# Patient Record
Sex: Male | Born: 1955 | Race: White | Hispanic: No | Marital: Married | Smoking: Former smoker
Health system: Southern US, Community
[De-identification: ages and names within clinical notes are randomized; demographics above are authoritative.]

## PROBLEM LIST (undated history)

## (undated) DIAGNOSIS — N419 Inflammatory disease of prostate, unspecified: Secondary | ICD-10-CM

## (undated) DIAGNOSIS — I10 Essential (primary) hypertension: Secondary | ICD-10-CM

## (undated) DIAGNOSIS — E785 Hyperlipidemia, unspecified: Secondary | ICD-10-CM

## (undated) HISTORY — DX: Essential (primary) hypertension: I10

## (undated) HISTORY — PX: VITRECTOMY: SHX106

## (undated) HISTORY — DX: Hyperlipidemia, unspecified: E78.5

---

## 2005-06-09 HISTORY — PX: COLONOSCOPY: SHX174

## 2012-08-26 ENCOUNTER — Telehealth: Payer: Self-pay | Admitting: Oncology

## 2012-08-26 NOTE — Telephone Encounter (Signed)
[facility] FOR PT TO RETURN CALL.  °

## 2012-08-31 ENCOUNTER — Telehealth: Payer: Self-pay | Admitting: Oncology

## 2012-08-31 NOTE — Telephone Encounter (Signed)
S/W PT IN RE NP APPT 04/17 @ 10:30 W/DR. SHADAD REFERRING DR. LITTLE  DX=HEMACHROMATOSIS WELCOME PACKET MAILED.

## 2012-08-31 NOTE — Telephone Encounter (Signed)
C/D 08/31/12 for appt.09/23/12

## 2012-09-16 ENCOUNTER — Other Ambulatory Visit: Payer: Self-pay | Admitting: Oncology

## 2012-09-16 DIAGNOSIS — D751 Secondary polycythemia: Secondary | ICD-10-CM

## 2012-09-17 ENCOUNTER — Telehealth: Payer: Self-pay | Admitting: Oncology

## 2012-09-17 NOTE — Telephone Encounter (Signed)
LVOM FOR PT IN RE TO NP APPT  R/S.

## 2012-09-23 ENCOUNTER — Other Ambulatory Visit: Payer: Self-pay | Admitting: Lab

## 2012-09-23 ENCOUNTER — Ambulatory Visit: Payer: Self-pay | Admitting: Oncology

## 2012-09-23 ENCOUNTER — Ambulatory Visit: Payer: Self-pay

## 2012-10-06 ENCOUNTER — Encounter: Payer: Self-pay | Admitting: Oncology

## 2012-10-07 ENCOUNTER — Encounter: Payer: Self-pay | Admitting: Oncology

## 2012-10-07 ENCOUNTER — Ambulatory Visit: Payer: BC Managed Care – PPO

## 2012-10-07 ENCOUNTER — Other Ambulatory Visit (HOSPITAL_BASED_OUTPATIENT_CLINIC_OR_DEPARTMENT_OTHER): Payer: BC Managed Care – PPO | Admitting: Lab

## 2012-10-07 ENCOUNTER — Ambulatory Visit (HOSPITAL_BASED_OUTPATIENT_CLINIC_OR_DEPARTMENT_OTHER): Payer: BC Managed Care – PPO | Admitting: Oncology

## 2012-10-07 DIAGNOSIS — D751 Secondary polycythemia: Secondary | ICD-10-CM

## 2012-10-07 LAB — COMPREHENSIVE METABOLIC PANEL (CC13)
ALT: 14 U/L (ref 0–55)
BUN: 18.7 mg/dL (ref 7.0–26.0)
CO2: 30 mEq/L — ABNORMAL HIGH (ref 22–29)
Calcium: 10 mg/dL (ref 8.4–10.4)
Creatinine: 1.2 mg/dL (ref 0.7–1.3)
Glucose: 108 mg/dl — ABNORMAL HIGH (ref 70–99)
Total Bilirubin: 0.5 mg/dL (ref 0.20–1.20)

## 2012-10-07 LAB — CBC WITH DIFFERENTIAL/PLATELET
BASO%: 0.7 % (ref 0.0–2.0)
Basophils Absolute: 0 10*3/uL (ref 0.0–0.1)
HCT: 51.5 % — ABNORMAL HIGH (ref 38.4–49.9)
HGB: 17.7 g/dL — ABNORMAL HIGH (ref 13.0–17.1)
LYMPH%: 14.6 % (ref 14.0–49.0)
MCHC: 34.4 g/dL (ref 32.0–36.0)
MONO#: 0.5 10*3/uL (ref 0.1–0.9)
NEUT%: 76.4 % — ABNORMAL HIGH (ref 39.0–75.0)
Platelets: 243 10*3/uL (ref 140–400)
WBC: 6.2 10*3/uL (ref 4.0–10.3)

## 2012-10-07 LAB — IRON AND TIBC
%SAT: 42 % (ref 20–55)
Iron: 130 ug/dL (ref 42–165)
TIBC: 307 ug/dL (ref 215–435)
UIBC: 177 ug/dL (ref 125–400)

## 2012-10-07 LAB — FERRITIN: Ferritin: 87 ng/mL (ref 22–322)

## 2012-10-07 NOTE — Progress Notes (Signed)
Checked in new pt with no financial concerns. °

## 2012-10-07 NOTE — Patient Instructions (Addendum)
1.  Issue:  Elevated Hemoglobin. 2.  Potential causes:  Hemochromatosis;  Polycythemia vera (PV)  3.  Work up:   *  Iron panel:  If elevated % saturation; will then proceed to gene test to rule out hemochromatosis.  *  If normal iron panel; then need to rule out PV with another gene test.  4.  Follow up:  Pending work up.

## 2012-10-07 NOTE — Progress Notes (Signed)
[facility]  Telephone:([phone] Fax:([phone]) [phone]     INITIAL HEMATOLOGY CONSULTATION    Referral MD:  Catha Gosselin, M.D.   Reason for Referral: Polycythemia     HPI: Oscar Bird is a 84 your man with distant history of smoking which he quit more than 30 years ago. He is also very active and runs about 50 miles a week. He has been having polycythemia vera for the past 3 years. He was kindly referred to the cancer Center for evaluation.   Oscar Bird presented to the clinic for the first time today with his wife. He denies any problem. Patient denies fever, decreased libido, change in his skin tone, erythromelalgia, anorexia, weight loss, fatigue, headache, visual changes, confusion, drenching night sweats, palpable lymph node swelling, mucositis, odynophagia, dysphagia, nausea vomiting, jaundice, chest pain, palpitation, shortness of breath, dyspnea on exertion, productive cough, gum bleeding, epistaxis, hematemesis, hemoptysis, abdominal pain, abdominal swelling, early satiety, melena, hematochezia, hematuria, skin rash, spontaneous bleeding, joint swelling, joint pain, heat or cold intolerance, bowel bladder incontinence, back pain, focal motor weakness, paresthesia, depression.    Past Medical History  Diagnosis Date  . HTN (hypertension)   . Borderline hyperlipidemia     diet control  :    Past Surgical History  Procedure Laterality Date  . Colonoscopy  2007    negative reportedly with [facility]  :   CURRENT MEDS: Current Outpatient Prescriptions  Medication Sig Dispense Refill  . CINNAMON PO Take 1 each by mouth daily as needed.      Marland Kitchen OVER THE COUNTER MEDICATION Serra peptase twice daily      . valsartan (DIOVAN) 160 MG tablet Take 160 mg by mouth daily.       No current facility-administered medications for this visit.      No Known Allergies:  Family History  Problem Relation Age of Onset  . Alzheimer's disease Mother   .  Heart attack Father   :  History   Social History  . Marital Status: Married    Spouse Name: N/A    Number of Children: 2  . Years of Education: N/A   Occupational History  .      manage realestate   Social History Main Topics  . Smoking status: Former Smoker -- 0.50 packs/day for 10 years    Quit date: 06/09/1973  . Smokeless tobacco: Former Neurosurgeon    Quit date: 06/09/1973  . Alcohol Use: No  . Drug Use: No  . Sexually Active: Not on file   Other Topics Concern  . Not on file   Social History Narrative  . No narrative on file  :  Exam: ECOG 0.   General:  well-nourished man, in no acute distress.  Eyes:  no scleral icterus.  ENT:  There were no oropharyngeal lesions.  Neck was without thyromegaly.  Lymphatics:  Negative cervical, supraclavicular or axillary adenopathy.  Respiratory: lungs were clear bilaterally without wheezing or crackles.  Cardiovascular:  Regular rate and rhythm, S1/S2, without murmur, rub or gallop.  There was no pedal edema.  GI:  abdomen was soft, flat, nontender, nondistended, without organomegaly.  Muscoloskeletal:  no spinal tenderness of palpation of vertebral spine.  Skin exam was without echymosis, petichae.  Neuro exam was nonfocal.  Patient was able to get on and off exam table without assistance.  Gait was normal.  Patient was alerted and oriented.  Attention was good.   Language was appropriate.  Mood was  normal without depression.  Speech was not pressured.  Thought content was not tangential.    LABS:  Lab Results  Component Value Date   WBC 6.2 10/07/2012   HGB 17.7* 10/07/2012   HCT 51.5* 10/07/2012   PLT 243 10/07/2012   GLUCOSE 108* 10/07/2012   ALT 14 10/07/2012   AST 17 10/07/2012   NA 146* 10/07/2012   K 5.5* 10/07/2012   CL 106 10/07/2012   CREATININE 1.2 10/07/2012   BUN 18.7 10/07/2012   CO2 30* 10/07/2012     ASSESSMENT AND PLAN:   1.  Issue:  Elevated Hemoglobin. 2.  Potential causes:  Polycythemia vera (PV) vs naturally elevated due to  his high level of aerobic exercise.  He has no symptoms of COPD, OSA, or takes diuretic to account for secondary polycythemia.  He has no family history or symptoms to suggest hemochromatosis 3.  Work up:   *  Iron panel:  If elevated % saturation; will then proceed to gene test to rule out hemochromatosis.  *  If normal iron panel; then need to rule out PV with JAK-2 mutation testing.  4.  Follow up:  Pending work up.   The length of time of the face-to-face encounter was 30 minutes. More than 50% of time was spent counseling and coordination of care.     Thank you for this referral.

## 2012-10-11 ENCOUNTER — Telehealth: Payer: Self-pay | Admitting: Oncology

## 2012-10-11 ENCOUNTER — Other Ambulatory Visit: Payer: Self-pay | Admitting: Oncology

## 2012-10-11 ENCOUNTER — Encounter: Payer: Self-pay | Admitting: Oncology

## 2012-10-11 DIAGNOSIS — D45 Polycythemia vera: Secondary | ICD-10-CM

## 2012-10-11 NOTE — Telephone Encounter (Signed)
lvm for pt regarding to 5.12 and 5.22.14 appt.Marland KitchenMarland KitchenMarland Kitchen

## 2012-10-18 ENCOUNTER — Other Ambulatory Visit: Payer: BC Managed Care – PPO | Admitting: Lab

## 2012-10-18 DIAGNOSIS — D45 Polycythemia vera: Secondary | ICD-10-CM

## 2012-10-20 ENCOUNTER — Ambulatory Visit: Payer: Self-pay | Admitting: Oncology

## 2012-10-20 ENCOUNTER — Ambulatory Visit: Payer: Self-pay

## 2012-10-20 ENCOUNTER — Other Ambulatory Visit: Payer: Self-pay | Admitting: Lab

## 2012-10-26 NOTE — Patient Instructions (Addendum)
1.  Issue:   Elevated hemoglobin (polycythemia). 2.  Testing:  A.  Screening for hemochromatosis was negative due to lack of family history and normal iron panel.  B.  Test for polycythemia vera (PV) was negative.  This rules out PV with about 90% certainty.   3.  What to do:  A.  Continue to monitor CBC about once a year.  B.  In the future, if hemoglobin continues to increase, we may consider diagnostic bone marrow biopsy.   C.  There is no indication to treat secondary polycythemia since there is no elevated risk of blood clot.  May consider blood donation if would like to normalize hemoglobin; however, this is not a must.

## 2012-10-28 ENCOUNTER — Ambulatory Visit (HOSPITAL_BASED_OUTPATIENT_CLINIC_OR_DEPARTMENT_OTHER): Payer: BC Managed Care – PPO | Admitting: Oncology

## 2012-10-28 VITALS — BP 161/92 | HR 96 | Temp 98.6°F | Resp 18 | Ht 68.0 in | Wt 143.4 lb

## 2012-10-28 DIAGNOSIS — D751 Secondary polycythemia: Secondary | ICD-10-CM

## 2012-10-29 NOTE — Progress Notes (Signed)
   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   CURRENT THERAPY:  Watchful observation.   INTERVAL HISTORY: Oscar Bird 57 y.o. male returns for regular follow up to discuss result of work up with his wife. He noticed that the palms of his hands are more flush than before.  He denied all other symptoms.   Past Medical History  Diagnosis Date  . HTN (hypertension)   . Borderline hyperlipidemia     diet control    Past Surgical History  Procedure Laterality Date  . Colonoscopy  2007    negative reportedly with [facility]    Current Outpatient Prescriptions  Medication Sig Dispense Refill  . CINNAMON PO Take 1 each by mouth daily as needed.      . diphenhydrAMINE (BENADRYL) 25 MG tablet Take 12.5 mg by mouth at bedtime as needed for itching or sleep.      Marland Kitchen OVER THE COUNTER MEDICATION Serra peptase twice daily      . valsartan (DIOVAN) 160 MG tablet Take 160 mg by mouth daily.       No current facility-administered medications for this visit.    ALLERGIES:  has No Known Allergies.  REVIEW OF SYSTEMS:  The rest of the 14-point review of system was negative.   Filed Vitals:   10/28/12 1021  BP: 161/92  Pulse: 96  Temp: 98.6 F (37 C)  Resp: 18   Wt Readings from Last 3 Encounters:  10/28/12 143 lb 6.4 oz (65.046 kg)  10/07/12 141 lb 8 oz (64.184 kg)   ECOG Performance status: 0  PHYSICAL EXAMINATION:  Deferred as there was no new symptoms.    LABORATORY/RADIOLOGY DATA:  Lab Results  Component Value Date   WBC 6.2 10/07/2012   HGB 17.7* 10/07/2012   HCT 51.5* 10/07/2012   PLT 243 10/07/2012   GLUCOSE 108* 10/07/2012   ALKPHOS 62 10/07/2012   ALT 14 10/07/2012   AST 17 10/07/2012   NA 146* 10/07/2012   K 5.5* 10/07/2012   CL 106 10/07/2012   CREATININE 1.2 10/07/2012   BUN 18.7 10/07/2012   CO2 30* 10/07/2012      ASSESSMENT  AND PLAN:    1.  Issue:   Elevated hemoglobin (polycythemia). 2.  Testing:  A.  Screening for hemochromatosis was negative due to lack of family history and normal iron panel.  B.  Test for polycythemia vera (PV) was negative.  This rules out PV with about 90% certainty.   3.  What to do:  I discharged patient back to PCP.   A.  Continue to monitor CBC about once a year.  B.  In the future, if hemoglobin continues to increase to around 20, we may consider further work up such as diagnostic bone marrow biopsy.   C.  There is no indication to treat secondary polycythemia since there is no elevated risk of blood clot.  May consider blood donation if would like to normalize hemoglobin; however, patients with secondary polycythemia are not at elevated risk of clot.    Thank you for this referral.     The length of time of the face-to-face encounter was 15 minutes. More than 50% of time was spent counseling and coordination of care.

## 2016-05-09 HISTORY — PX: COLONOSCOPY: SHX174

## 2016-05-13 LAB — HM COLONOSCOPY

## 2016-06-11 DIAGNOSIS — Z1211 Encounter for screening for malignant neoplasm of colon: Secondary | ICD-10-CM | POA: Diagnosis not present

## 2017-05-20 DIAGNOSIS — R8290 Unspecified abnormal findings in urine: Secondary | ICD-10-CM | POA: Diagnosis not present

## 2017-05-20 DIAGNOSIS — M549 Dorsalgia, unspecified: Secondary | ICD-10-CM | POA: Diagnosis not present

## 2017-07-16 DIAGNOSIS — Z125 Encounter for screening for malignant neoplasm of prostate: Secondary | ICD-10-CM | POA: Diagnosis not present

## 2017-07-16 DIAGNOSIS — I10 Essential (primary) hypertension: Secondary | ICD-10-CM | POA: Diagnosis not present

## 2017-07-16 DIAGNOSIS — Z0000 Encounter for general adult medical examination without abnormal findings: Secondary | ICD-10-CM | POA: Diagnosis not present

## 2017-07-16 DIAGNOSIS — E785 Hyperlipidemia, unspecified: Secondary | ICD-10-CM | POA: Diagnosis not present

## 2018-01-14 DIAGNOSIS — N410 Acute prostatitis: Secondary | ICD-10-CM | POA: Diagnosis not present

## 2018-01-14 DIAGNOSIS — R300 Dysuria: Secondary | ICD-10-CM | POA: Diagnosis not present

## 2018-01-27 DIAGNOSIS — R109 Unspecified abdominal pain: Secondary | ICD-10-CM | POA: Diagnosis not present

## 2018-01-29 ENCOUNTER — Other Ambulatory Visit: Payer: Self-pay | Admitting: Family Medicine

## 2018-01-29 ENCOUNTER — Ambulatory Visit
Admission: RE | Admit: 2018-01-29 | Discharge: 2018-01-29 | Disposition: A | Discharge status: Home / Self Care | Payer: 59 | Source: Ambulatory Visit | Attending: Family Medicine | Admitting: Family Medicine

## 2018-01-29 DIAGNOSIS — K5790 Diverticulosis of intestine, part unspecified, without perforation or abscess without bleeding: Secondary | ICD-10-CM | POA: Diagnosis not present

## 2018-01-29 DIAGNOSIS — R109 Unspecified abdominal pain: Secondary | ICD-10-CM

## 2018-02-16 DIAGNOSIS — M545 Low back pain: Secondary | ICD-10-CM | POA: Diagnosis not present

## 2018-02-16 DIAGNOSIS — N411 Chronic prostatitis: Secondary | ICD-10-CM | POA: Diagnosis not present

## 2018-08-04 DIAGNOSIS — N410 Acute prostatitis: Secondary | ICD-10-CM | POA: Diagnosis not present

## 2018-08-27 DIAGNOSIS — Z23 Encounter for immunization: Secondary | ICD-10-CM | POA: Diagnosis not present

## 2018-08-27 DIAGNOSIS — Z0000 Encounter for general adult medical examination without abnormal findings: Secondary | ICD-10-CM | POA: Diagnosis not present

## 2019-03-29 ENCOUNTER — Other Ambulatory Visit: Payer: Self-pay | Admitting: Registered"

## 2019-03-29 DIAGNOSIS — Z20822 Contact with and (suspected) exposure to covid-19: Secondary | ICD-10-CM

## 2019-03-30 LAB — NOVEL CORONAVIRUS, NAA: SARS-CoV-2, NAA: NOT DETECTED

## 2020-09-13 ENCOUNTER — Other Ambulatory Visit (HOSPITAL_COMMUNITY): Payer: Self-pay | Admitting: Family Medicine

## 2020-09-13 DIAGNOSIS — Z8249 Family history of ischemic heart disease and other diseases of the circulatory system: Secondary | ICD-10-CM

## 2020-09-21 ENCOUNTER — Ambulatory Visit
Admission: RE | Admit: 2020-09-21 | Discharge: 2020-09-21 | Disposition: A | Discharge status: Home / Self Care | Payer: 59 | Source: Ambulatory Visit | Attending: Family Medicine | Admitting: Family Medicine

## 2020-09-21 ENCOUNTER — Other Ambulatory Visit: Payer: Self-pay

## 2020-09-21 DIAGNOSIS — Z8249 Family history of ischemic heart disease and other diseases of the circulatory system: Secondary | ICD-10-CM | POA: Insufficient documentation

## 2021-07-16 ENCOUNTER — Other Ambulatory Visit: Payer: Self-pay | Admitting: Family Medicine

## 2021-07-16 DIAGNOSIS — R1031 Right lower quadrant pain: Secondary | ICD-10-CM

## 2021-07-25 ENCOUNTER — Ambulatory Visit
Admission: RE | Admit: 2021-07-25 | Discharge: 2021-07-25 | Disposition: A | Discharge status: Home / Self Care | Payer: Medicare Other | Source: Ambulatory Visit | Attending: Family Medicine | Admitting: Family Medicine

## 2021-07-25 ENCOUNTER — Other Ambulatory Visit: Payer: Self-pay

## 2021-07-25 DIAGNOSIS — R1031 Right lower quadrant pain: Secondary | ICD-10-CM

## 2021-07-25 MED ORDER — IOPAMIDOL (ISOVUE-300) INJECTION 61%
100.0000 mL | Freq: Once | INTRAVENOUS | Status: AC | PRN
  Administered 2021-07-25: 100 mL via INTRAVENOUS

## 2021-10-03 ENCOUNTER — Other Ambulatory Visit: Payer: Self-pay

## 2021-10-03 ENCOUNTER — Encounter (HOSPITAL_BASED_OUTPATIENT_CLINIC_OR_DEPARTMENT_OTHER): Payer: Self-pay | Admitting: Surgery

## 2021-10-04 ENCOUNTER — Encounter (HOSPITAL_BASED_OUTPATIENT_CLINIC_OR_DEPARTMENT_OTHER)
Admission: RE | Admit: 2021-10-04 | Discharge: 2021-10-04 | Disposition: A | Discharge status: Home / Self Care | Payer: Medicare Other | Source: Ambulatory Visit | Attending: Surgery | Admitting: Surgery

## 2021-10-04 DIAGNOSIS — Z01810 Encounter for preprocedural cardiovascular examination: Secondary | ICD-10-CM | POA: Insufficient documentation

## 2021-10-09 ENCOUNTER — Ambulatory Visit: Payer: Self-pay | Admitting: Surgery

## 2021-10-10 ENCOUNTER — Encounter (HOSPITAL_BASED_OUTPATIENT_CLINIC_OR_DEPARTMENT_OTHER)
Admission: RE | Disposition: A | Discharge status: Home / Self Care | Payer: Self-pay | Source: Ambulatory Visit | Attending: Surgery

## 2021-10-10 ENCOUNTER — Ambulatory Visit (HOSPITAL_BASED_OUTPATIENT_CLINIC_OR_DEPARTMENT_OTHER): Payer: Medicare Other | Admitting: Anesthesiology

## 2021-10-10 ENCOUNTER — Encounter (HOSPITAL_BASED_OUTPATIENT_CLINIC_OR_DEPARTMENT_OTHER): Payer: Self-pay | Admitting: Surgery

## 2021-10-10 ENCOUNTER — Other Ambulatory Visit: Payer: Self-pay

## 2021-10-10 ENCOUNTER — Ambulatory Visit (HOSPITAL_BASED_OUTPATIENT_CLINIC_OR_DEPARTMENT_OTHER)
Admission: RE | Admit: 2021-10-10 | Discharge: 2021-10-10 | Disposition: A | Discharge status: Home / Self Care | Payer: Medicare Other | Source: Ambulatory Visit | Attending: Surgery | Admitting: Surgery

## 2021-10-10 DIAGNOSIS — I10 Essential (primary) hypertension: Secondary | ICD-10-CM | POA: Insufficient documentation

## 2021-10-10 DIAGNOSIS — K4090 Unilateral inguinal hernia, without obstruction or gangrene, not specified as recurrent: Secondary | ICD-10-CM

## 2021-10-10 DIAGNOSIS — X500XXA Overexertion from strenuous movement or load, initial encounter: Secondary | ICD-10-CM | POA: Diagnosis not present

## 2021-10-10 DIAGNOSIS — Z87891 Personal history of nicotine dependence: Secondary | ICD-10-CM | POA: Diagnosis not present

## 2021-10-10 HISTORY — DX: Inflammatory disease of prostate, unspecified: N41.9

## 2021-10-10 HISTORY — PX: INGUINAL HERNIA REPAIR: SHX194

## 2021-10-10 SURGERY — REPAIR, HERNIA, INGUINAL, ADULT
Anesthesia: General | Site: Groin | Laterality: Right

## 2021-10-10 MED ORDER — BUPIVACAINE-EPINEPHRINE (PF) 0.5% -1:200000 IJ SOLN
INTRAMUSCULAR | Status: DC | PRN
  Administered 2021-10-10: 30 mL

## 2021-10-10 MED ORDER — MIDAZOLAM HCL 2 MG/2ML IJ SOLN: 0.5000 mg | Freq: Once | INTRAMUSCULAR | Status: DC | PRN

## 2021-10-10 MED ORDER — MIDAZOLAM HCL 2 MG/2ML IJ SOLN
2.0000 mg | Freq: Once | INTRAMUSCULAR | Status: AC
  Administered 2021-10-10: 2 mg via INTRAVENOUS

## 2021-10-10 MED ORDER — DEXAMETHASONE SODIUM PHOSPHATE 10 MG/ML IJ SOLN
INTRAMUSCULAR | Status: AC
  Filled 2021-10-10: qty 1

## 2021-10-10 MED ORDER — EPHEDRINE SULFATE (PRESSORS) 50 MG/ML IJ SOLN
INTRAMUSCULAR | Status: DC | PRN
  Administered 2021-10-10: 5 mg via INTRAVENOUS
  Administered 2021-10-10: 10 mg via INTRAVENOUS

## 2021-10-10 MED ORDER — FENTANYL CITRATE (PF) 100 MCG/2ML IJ SOLN
100.0000 ug | Freq: Once | INTRAMUSCULAR | Status: AC
  Administered 2021-10-10: 100 ug via INTRAVENOUS

## 2021-10-10 MED ORDER — CEFAZOLIN SODIUM-DEXTROSE 2-4 GM/100ML-% IV SOLN
INTRAVENOUS | Status: AC
  Filled 2021-10-10: qty 100

## 2021-10-10 MED ORDER — OXYCODONE HCL 5 MG/5ML PO SOLN: 5.0000 mg | Freq: Once | ORAL | Status: DC | PRN

## 2021-10-10 MED ORDER — OXYCODONE HCL 5 MG PO TABS: 5.0000 mg | ORAL_TABLET | Freq: Four times a day (QID) | ORAL | 0 refills | Status: DC | PRN

## 2021-10-10 MED ORDER — ACETAMINOPHEN 500 MG PO TABS
ORAL_TABLET | ORAL | Status: AC
  Filled 2021-10-10: qty 2

## 2021-10-10 MED ORDER — CHLORHEXIDINE GLUCONATE CLOTH 2 % EX PADS: 6.0000 | MEDICATED_PAD | Freq: Once | CUTANEOUS | Status: DC

## 2021-10-10 MED ORDER — MIDAZOLAM HCL 2 MG/2ML IJ SOLN
INTRAMUSCULAR | Status: AC
  Filled 2021-10-10: qty 2

## 2021-10-10 MED ORDER — AMISULPRIDE (ANTIEMETIC) 5 MG/2ML IV SOLN
5.0000 mg | Freq: Once | INTRAVENOUS | Status: AC
  Administered 2021-10-10: 5 mg via INTRAVENOUS

## 2021-10-10 MED ORDER — HYDROMORPHONE HCL 1 MG/ML IJ SOLN
INTRAMUSCULAR | Status: AC
  Filled 2021-10-10: qty 0.5

## 2021-10-10 MED ORDER — LIDOCAINE 2% (20 MG/ML) 5 ML SYRINGE
INTRAMUSCULAR | Status: DC | PRN
  Administered 2021-10-10: 40 mg via INTRAVENOUS

## 2021-10-10 MED ORDER — EPHEDRINE 5 MG/ML INJ
INTRAVENOUS | Status: AC
  Filled 2021-10-10: qty 5

## 2021-10-10 MED ORDER — ACETAMINOPHEN 500 MG PO TABS
1000.0000 mg | ORAL_TABLET | Freq: Once | ORAL | Status: AC
  Administered 2021-10-10: 1000 mg via ORAL

## 2021-10-10 MED ORDER — HYDROMORPHONE HCL 1 MG/ML IJ SOLN
0.2500 mg | INTRAMUSCULAR | Status: DC | PRN
  Administered 2021-10-10 (×3): 0.5 mg via INTRAVENOUS

## 2021-10-10 MED ORDER — DEXAMETHASONE SODIUM PHOSPHATE 4 MG/ML IJ SOLN
INTRAMUSCULAR | Status: DC | PRN
  Administered 2021-10-10: 8 mg via INTRAVENOUS

## 2021-10-10 MED ORDER — PROPOFOL 10 MG/ML IV BOLUS
INTRAVENOUS | Status: AC
  Filled 2021-10-10: qty 20

## 2021-10-10 MED ORDER — MEPERIDINE HCL 25 MG/ML IJ SOLN: 6.2500 mg | INTRAMUSCULAR | Status: DC | PRN

## 2021-10-10 MED ORDER — AMISULPRIDE (ANTIEMETIC) 5 MG/2ML IV SOLN
INTRAVENOUS | Status: AC
  Filled 2021-10-10: qty 2

## 2021-10-10 MED ORDER — PROPOFOL 10 MG/ML IV BOLUS
INTRAVENOUS | Status: DC | PRN
  Administered 2021-10-10: 200 mg via INTRAVENOUS

## 2021-10-10 MED ORDER — LIDOCAINE 2% (20 MG/ML) 5 ML SYRINGE
INTRAMUSCULAR | Status: AC
  Filled 2021-10-10: qty 5

## 2021-10-10 MED ORDER — BUPIVACAINE-EPINEPHRINE 0.25% -1:200000 IJ SOLN
INTRAMUSCULAR | Status: DC | PRN
  Administered 2021-10-10: 10 mL

## 2021-10-10 MED ORDER — FENTANYL CITRATE (PF) 100 MCG/2ML IJ SOLN
INTRAMUSCULAR | Status: AC
  Filled 2021-10-10: qty 2

## 2021-10-10 MED ORDER — HYDROMORPHONE HCL 1 MG/ML IJ SOLN
INTRAMUSCULAR | Status: AC
Start: 2021-10-10 — End: ?
  Filled 2021-10-10: qty 0.5

## 2021-10-10 MED ORDER — IBUPROFEN 800 MG PO TABS: 800.0000 mg | ORAL_TABLET | Freq: Three times a day (TID) | ORAL | 0 refills | Status: DC | PRN

## 2021-10-10 MED ORDER — CEFAZOLIN IN SODIUM CHLORIDE 3-0.9 GM/100ML-% IV SOLN
3.0000 g | INTRAVENOUS | Status: AC
Start: 2021-10-10 — End: 2021-10-10
  Administered 2021-10-10: 2 g via INTRAVENOUS

## 2021-10-10 MED ORDER — LACTATED RINGERS IV SOLN: INTRAVENOUS | Status: DC

## 2021-10-10 MED ORDER — ONDANSETRON HCL 4 MG/2ML IJ SOLN
INTRAMUSCULAR | Status: DC | PRN
Start: 2021-10-10 — End: 2021-10-10
  Administered 2021-10-10: 4 mg via INTRAVENOUS

## 2021-10-10 MED ORDER — OXYCODONE HCL 5 MG PO TABS: 5.0000 mg | ORAL_TABLET | Freq: Once | ORAL | Status: DC | PRN

## 2021-10-10 MED ORDER — ONDANSETRON HCL 4 MG/2ML IJ SOLN
INTRAMUSCULAR | Status: AC
  Filled 2021-10-10: qty 2

## 2021-10-10 SURGICAL SUPPLY — 54 items
ADH SKN CLS APL DERMABOND .7 (GAUZE/BANDAGES/DRESSINGS) ×1
APL PRP STRL LF DISP 70% ISPRP (MISCELLANEOUS) ×1
BLADE CLIPPER SURG (BLADE) ×1 IMPLANT
BLADE SURG 15 STRL LF DISP TIS (BLADE) ×1 IMPLANT
BLADE SURG 15 STRL SS (BLADE) ×2
CANISTER SUCT 1200ML W/VALVE (MISCELLANEOUS) IMPLANT
CHLORAPREP W/TINT 26 (MISCELLANEOUS) ×2 IMPLANT
COVER BACK TABLE 60X90IN (DRAPES) ×2 IMPLANT
COVER MAYO STAND STRL (DRAPES) ×2 IMPLANT
DERMABOND ADVANCED (GAUZE/BANDAGES/DRESSINGS) ×1
DERMABOND ADVANCED .7 DNX12 (GAUZE/BANDAGES/DRESSINGS) ×1 IMPLANT
DRAIN PENROSE .5X12 LATEX STL (DRAIN) ×2 IMPLANT
DRAPE LAPAROTOMY TRNSV 102X78 (DRAPES) ×2 IMPLANT
DRAPE UTILITY XL STRL (DRAPES) ×2 IMPLANT
ELECT COATED BLADE 2.86 ST (ELECTRODE) ×2 IMPLANT
ELECT REM PT RETURN 9FT ADLT (ELECTROSURGICAL) ×2
ELECTRODE REM PT RTRN 9FT ADLT (ELECTROSURGICAL) ×1 IMPLANT
GAUZE 4X4 16PLY ~~LOC~~+RFID DBL (SPONGE) ×1 IMPLANT
GAUZE SPONGE 4X4 12PLY STRL LF (GAUZE/BANDAGES/DRESSINGS) IMPLANT
GLOVE BIOGEL PI IND STRL 7.0 (GLOVE) IMPLANT
GLOVE BIOGEL PI IND STRL 8 (GLOVE) ×1 IMPLANT
GLOVE BIOGEL PI INDICATOR 7.0 (GLOVE) ×1
GLOVE BIOGEL PI INDICATOR 8 (GLOVE) ×1
GLOVE ECLIPSE 8.0 STRL XLNG CF (GLOVE) ×2 IMPLANT
GLOVE SURG SYN 7.5  E (GLOVE) ×1
GLOVE SURG SYN 7.5 E (GLOVE) ×1 IMPLANT
GLOVE SURG SYN 7.5 PF PI (GLOVE) IMPLANT
GOWN STRL REUS W/ TWL LRG LVL3 (GOWN DISPOSABLE) ×2 IMPLANT
GOWN STRL REUS W/ TWL XL LVL3 (GOWN DISPOSABLE) ×1 IMPLANT
GOWN STRL REUS W/TWL LRG LVL3 (GOWN DISPOSABLE) ×4
GOWN STRL REUS W/TWL XL LVL3 (GOWN DISPOSABLE) ×2
MESH HERNIA SYS ULTRAPRO LRG (Mesh General) ×1 IMPLANT
NDL HYPO 25X1 1.5 SAFETY (NEEDLE) ×1 IMPLANT
NEEDLE HYPO 25X1 1.5 SAFETY (NEEDLE) ×2 IMPLANT
NS IRRIG 1000ML POUR BTL (IV SOLUTION) ×1 IMPLANT
PACK BASIN DAY SURGERY FS (CUSTOM PROCEDURE TRAY) ×2 IMPLANT
PENCIL SMOKE EVACUATOR (MISCELLANEOUS) ×2 IMPLANT
SLEEVE SCD COMPRESS KNEE MED (STOCKING) ×2 IMPLANT
SPONGE T-LAP 4X18 ~~LOC~~+RFID (SPONGE) ×1 IMPLANT
STRIP CLOSURE SKIN 1/2X4 (GAUZE/BANDAGES/DRESSINGS) IMPLANT
SUT MON AB 4-0 PC3 18 (SUTURE) ×2 IMPLANT
SUT NOVA 0 T19/GS 22DT (SUTURE) IMPLANT
SUT NOVA NAB DX-16 0-1 5-0 T12 (SUTURE) ×4 IMPLANT
SUT VIC AB 2-0 SH 27 (SUTURE) ×2
SUT VIC AB 2-0 SH 27XBRD (SUTURE) ×1 IMPLANT
SUT VIC AB 3-0 54X BRD REEL (SUTURE) IMPLANT
SUT VIC AB 3-0 BRD 54 (SUTURE)
SUT VICRYL 3-0 CR8 SH (SUTURE) ×2 IMPLANT
SUT VICRYL AB 2 0 TIE (SUTURE) IMPLANT
SUT VICRYL AB 2 0 TIES (SUTURE)
SYR CONTROL 10ML LL (SYRINGE) ×2 IMPLANT
TOWEL GREEN STERILE FF (TOWEL DISPOSABLE) ×2 IMPLANT
TUBE CONNECTING 20X1/4 (TUBING) IMPLANT
YANKAUER SUCT BULB TIP NO VENT (SUCTIONS) IMPLANT

## 2021-10-10 NOTE — Anesthesia Postprocedure Evaluation (Signed)
Anesthesia Post Note ? ?Patient: Oscar Bird ? ?Procedure(s) Performed: OPEN RIGHT INGUINAL HERNIA REPAIR WITH MESH (Right: Groin) ? ?  ? ?Patient location during evaluation: PACU ?Anesthesia Type: General ?Level of consciousness: awake and alert, patient cooperative and oriented ?Pain management: pain level controlled ?Vital Signs Assessment: post-procedure vital signs reviewed and stable ?Respiratory status: spontaneous breathing, nonlabored ventilation and respiratory function stable ?Cardiovascular status: blood pressure returned to baseline and stable ?Postop Assessment: no apparent nausea or vomiting, able to ambulate and adequate PO intake ?Anesthetic complications: no ? ? ?No notable events documented. ? ?Last Vitals:  ?Vitals:  ? 10/10/21 1100 10/10/21 1115  ?BP: (!) 162/84 (!) 168/85  ?Pulse: 82 85  ?Resp: 13 14  ?Temp:    ?SpO2: 97% 97%  ?  ?Last Pain:  ?Vitals:  ? 10/10/21 1115  ?TempSrc:   ?PainSc: 5   ? ? ?  ?  ?  ?  ?  ?  ? ?[name],E. Roczen Waymire ? ? ? ? ?

## 2021-10-10 NOTE — Progress Notes (Signed)
Assisted Dr. Annye Asa with right, transabdominal plane, ultrasound guided block. Side rails up, monitors on throughout procedure. See vital signs in flow sheet. Tolerated Procedure well. ?

## 2021-10-10 NOTE — Interval H&P Note (Signed)
History and Physical Interval Note: ? ?10/10/2021 ?8:51 AM ? ?Oscar Bird  has presented today for surgery, with the diagnosis of RIGHT INGUINAL HERNIA.  The various methods of treatment have been discussed with the patient and family. After consideration of risks, benefits and other options for treatment, the patient has consented to  Procedure(s): ?OPEN RIGHT INGUINAL HERNIA REPAIR WITH MESH (Right) as a surgical intervention.  The patient's history has been reviewed, patient examined, no change in status, stable for surgery.  I have reviewed the patient's chart and labs.  Questions were answered to the patient's satisfaction.   ? ? ?Thong Feeny A Robbi Spells ? ? ?

## 2021-10-10 NOTE — Transfer of Care (Signed)
Immediate Anesthesia Transfer of Care Note ? ?Patient: Oscar Bird ? ?Procedure(s) Performed: OPEN RIGHT INGUINAL HERNIA REPAIR WITH MESH (Right: Groin) ? ?Patient Location: PACU ? ?Anesthesia Type:General and Regional ? ?Level of Consciousness: drowsy ? ?Airway & Oxygen Therapy: Patient Spontanous Breathing and Patient connected to face mask oxygen ? ?Post-op Assessment: Report given to RN and Post -op Vital signs reviewed and stable ? ?Post vital signs: Reviewed and stable ? ?Last Vitals:  ?Vitals Value Taken Time  ?BP 178/91 10/10/21 1037  ?Temp    ?Pulse 89 10/10/21 1038  ?Resp 13 10/10/21 1038  ?SpO2 100 % 10/10/21 1038  ?Vitals shown include unvalidated device data. ? ?Last Pain:  ?Vitals:  ? 10/10/21 0802  ?TempSrc: Oral  ?PainSc: 0-No pain  ?   ? ?  ? ?Complications: No notable events documented. ?

## 2021-10-10 NOTE — Anesthesia Preprocedure Evaluation (Addendum)
Anesthesia Evaluation  ?Patient identified by MRN, date of birth, ID band ?Patient awake ? ? ? ?Reviewed: ?Allergy & Precautions, NPO status , Patient's Chart, lab work & pertinent test results ? ?History of Anesthesia Complications ?Negative for: history of anesthetic complications ? ?Airway ?Mallampati: I ? ?TM Distance: >3 FB ?Neck ROM: Full ? ? ? Dental ? ?(+) Dental Advisory Given ?  ?Pulmonary ?former smoker,  ?  ?breath sounds clear to auscultation ? ? ? ? ? ? Cardiovascular ?hypertension, Pt. on medications ?(-) angina ?Rhythm:Regular Rate:Normal ? ? ?  ?Neuro/Psych ?negative neurological ROS ?   ? GI/Hepatic ?negative GI ROS, Neg liver ROS,   ?Endo/Other  ?negative endocrine ROS ? Renal/GU ?negative Renal ROS  ? ?  ?Musculoskeletal ? ? Abdominal ?  ?Peds ? Hematology ?negative hematology ROS ?(+)   ?Anesthesia Other Findings ? ? Reproductive/Obstetrics ? ?  ? ? ? ? ? ? ? ? ? ? ? ? ? ?  ?  ? ? ? ? ? ? ? ?Anesthesia Physical ?Anesthesia Plan ? ?ASA: 2 ? ?Anesthesia Plan: General  ? ?Post-op Pain Management: Tylenol PO (pre-op)*  ? ?Induction: Intravenous ? ?PONV Risk Score and Plan: 2 and Ondansetron and Dexamethasone ? ?Airway Management Planned: LMA ? ?Additional Equipment: None ? ?Intra-op Plan:  ? ?Post-operative Plan:  ? ?Informed Consent: I have reviewed the patients History and Physical, chart, labs and discussed the procedure including the risks, benefits and alternatives for the proposed anesthesia with the patient or authorized representative who has indicated his/her understanding and acceptance.  ? ? ? ?Dental advisory given ? ?Plan Discussed with: CRNA and Surgeon ? ?Anesthesia Plan Comments: (Plan routine monitors, GA with TAP block for post op analgesia)  ? ? ? ? ? ?Anesthesia Quick Evaluation ? ?

## 2021-10-10 NOTE — Anesthesia Procedure Notes (Signed)
Procedure Name: LMA Insertion ?Date/Time: 10/10/2021 9:25 AM ?Performed by: Ezequiel Kayser, CRNA ?Pre-anesthesia Checklist: Patient identified, Emergency Drugs available, Suction available and Patient being monitored ?Patient Re-evaluated:Patient Re-evaluated prior to induction ?Oxygen Delivery Method: Circle System Utilized ?Preoxygenation: Pre-oxygenation with 100% oxygen ?Induction Type: IV induction ?Ventilation: Mask ventilation without difficulty ?LMA: LMA inserted ?LMA Size: 4.0 ?Number of attempts: 1 ?Airway Equipment and Method: Bite block ?Placement Confirmation: positive ETCO2 ?Tube secured with: Tape ?Dental Injury: Teeth and Oropharynx as per pre-operative assessment  ? ? ? ? ?

## 2021-10-10 NOTE — H&P (Signed)
History of Present Illness: ?Oscar Bird is a 66 y.o. male who is seen today as an office consultation for evaluation of New Consultation (Right Ing Hernia ) ?.  ? ?Patient presents with a 1 year history of right groin pain. He has been evaluated by his general practitioner as well as physical therapy. He states he has severe right groin pain especially with lifting, pushing or pulling. He is very active and this has been limiting his quality of life. He notices symptoms about a year ago. They were doing some traveling out [place] and he was lifting some heavy objects and this caused his right groin pain. He was evaluated and also went to see his urologist since he has a history of prostatitis. He had physical therapy and pelvic floor training. His symptoms have persisted despite conservative measures. He had an ultrasound which showed what appeared to be a right inguinal hernia and a CT scan which was read as normal. I reviewed both. He has no nausea or vomiting. The pain is sharp in nature location is right inguinal canal with some radiation into his leg and hip. This is made worse with exertion. It is made better with rest. ? ?Review of Systems: ?A complete review of systems was obtained from the patient. I have reviewed this information and discussed as appropriate with the patient. See HPI as well for other ROS. ? ? ? ?Medical History: ?Past Medical History:  ?Diagnosis Date  ? Hyperlipidemia  ? Hypertension  ? Sleep apnea  ? ?There is no problem list on file for this patient. ? ?History reviewed. No pertinent surgical history.  ? ?Allergies  ?Allergen Reactions  ? Ace Inhibitors Other (See Comments)  ? Sulfa (Sulfonamide Antibiotics) Hives  ? ?Current Outpatient Medications on File Prior to Visit  ?Medication Sig Dispense Refill  ? atorvastatin (LIPITOR) 10 MG tablet 1 tablet  ? cranberry 400 mg Cap as directed  ? glucosamine/chondr su A sod (OSTEO BI-FLEX ORAL) Take by mouth  ? Herbal Supplement Beet Root  ?  Herbal Supplement CBD oil  ? krill oil 500 mg Cap Take by mouth  ? TURMERIC ORAL Take by mouth  ? valsartan (DIOVAN) 160 MG tablet 2 tablets  ? ?No current facility-administered medications on file prior to visit.  ? ?History reviewed. No pertinent family history.  ? ?Social History  ? ?Tobacco Use  ?Smoking Status Former  ? Types: Cigarettes  ?Smokeless Tobacco Never  ? ? ?Social History  ? ?Socioeconomic History  ? Marital status: Married  ?Tobacco Use  ? Smoking status: Former  ?Types: Cigarettes  ? Smokeless tobacco: Never  ?Vaping Use  ? Vaping Use: Unknown  ?Substance and Sexual Activity  ? Alcohol use: Not Currently  ? Drug use: Never  ? ?Objective:  ? ?Vitals:  ?09/23/21 1117  ?BP: (!) 190/90  ?Pulse: 107  ?Temp: 37.1 ?C (98.7 ?F)  ?SpO2: 99%  ?Weight: 63.7 kg (140 lb 6.4 oz)  ?Height: 170.2 cm ('5\' 7"'$ )  ? ?Body mass index is 21.99 kg/m?. ? ?Physical Exam ?Constitutional:  ?Appearance: Normal appearance.  ?HENT:  ?Head: Normocephalic.  ?Mouth/Throat:  ?Mouth: Mucous membranes are moist.  ?Eyes:  ?General: No scleral icterus. ?Cardiovascular:  ?Rate and Rhythm: Normal rate.  ?Pulmonary:  ?Effort: Pulmonary effort is normal.  ?Breath sounds: No stridor.  ?Abdominal:  ?General: Abdomen is flat.  ?Palpations: Abdomen is soft.  ?Hernia: A hernia is present. Hernia is present in the right inguinal area. There is no hernia in the  left inguinal area, right femoral area or left femoral area.  ?Musculoskeletal:  ?General: Normal range of motion.  ?Cervical back: Normal range of motion.  ?Skin: ?General: Skin is warm.  ?Neurological:  ?General: No focal deficit present.  ?Mental Status: He is alert.  ?Psychiatric:  ?Mood and Affect: Mood normal.  ?Behavior: Behavior normal.  ?Thought Content: Thought content normal.  ? ? ? ?Labs, Imaging and Diagnostic Testing: ?CT scan reviewed by myself which shows a right inguinal hernia. Ultrasound images could not be opened with report indicated a right inguinal  hernia. ? ?Assessment and Plan:  ? ?Diagnoses and all orders for this visit: ? ?Non-recurrent unilateral inguinal hernia without obstruction or gangrene ? ?Right inguinal hernia reducible, symptomatic ? ?Discussed pros and cons of surgical repair of his symptomatic right inguinal hernia. Discussed both laparoscopic and open techniques and the use of mesh in both. Discussed potential complications of each approach as well as expected recovery. Risk of bleeding, infection, chronic groin pain, organ injury, blood vessel injury, nerve injury, anesthesia complication, cardiovascular complication, recurrence, and DVT, as well as need for revisional surgery. Recurrence rates discussed of each technique. After lengthy discussion with the patient and his wife, he is opted for open repair of his right inguinal hernia with mesh. Discussed general anesthesia and TAP block. He does have urinary issues and history of prostatitis. Recommend he takes his Flomax 2 days before surgery and continues after surgery to reduce her risk of urinary retention. ? ?No follow-ups on file. ? ?Kennieth Francois, MD   ?

## 2021-10-10 NOTE — Anesthesia Procedure Notes (Signed)
Anesthesia Regional Block: TAP block  ? ?Pre-Anesthetic Checklist: , timeout performed,  Correct Patient, Correct Site, Correct Laterality,  Correct Procedure, Correct Position, site marked,  Risks and benefits discussed,  Surgical consent,  Pre-op evaluation,  At surgeon's request and post-op pain management ? ?Laterality: Right ? ?Prep: chloraprep     ?  ?Needles:  ?Injection technique: Single-shot ? ?Needle Type: Echogenic Needle   ? ? ?Needle Length: 9cm  ?Needle Gauge: 21  ? ? ? ?Additional Needles: ? ? ?Procedures:,,,, ultrasound used (permanent image in chart),,    ?Narrative:  ?Start time: 10/10/2021 8:16 AM ?End time: 10/10/2021 8:23 AM ?Injection made incrementally with aspirations every 5 mL. ? ?Performed by: Personally  ?Anesthesiologist: Annye Asa, MD ? ?Additional Notes: ?Pt identified in Holding room.  Monitors applied. Working IV access confirmed. Sterile prep R flank/abdomen.  #21ga ECHOgenic Arrow block needle into TAP with US guidance.  30cc 0.55% Bupivacaine 1:200k epi injected incrementally after negative test dose, good plane separation.  Patient asymptomatic, VSS, no heme aspirated, tolerated well.   Jenita Seashore, MD ? ? ? ? ?

## 2021-10-10 NOTE — Op Note (Signed)
Right inguinal hernia, Open, repair with mesh procedure Note ? ?Indications: The patient presented with a history of a right, reducible inguinal hernia.  We discussed open and laparoscopic repairs with mesh.  The pros and cons of each were reviewed.  Complications of each as well as long-term recovery and recurrence rates.  He desired to pursue open repair of his right inguinal hernia.  The risk of hernia repair include bleeding,  Infection,   Recurrence of the hernia,  Mesh use, chronic pain,  Organ injury,  Bowel injury,  Bladder injury,   nerve injury with numbness around the incision,  Death,  and worsening of preexisting  medical problems.  The alternatives to surgery have been discussed as well..  Long term expectations of both operative and non operative treatments have been discussed.   The patient agrees to proceed.  ? ?Pre-operative Diagnosis: right reducible inguinal hernia ? ?Post-operative Diagnosis: same ? ?Surgeon: Turner Daniels MD ? ?Assistants: None ? ?Anesthesia: General LMA anesthesia, Local anesthesia 0.25.% bupivacaine, and TEP block  ? ?ASA Class: 2 ? ?Procedure Details  ?The patient was seen again in the Holding Room. The risks, benefits, complications, treatment options, and expected outcomes were discussed with the patient. The possibilities of reaction to medication, pulmonary aspiration, perforation of viscus, bleeding, recurrent infection, the need for additional procedures, and development of a complication requiring transfusion or further operation were discussed with the patient and/or family. There was concurrence with the proposed plan, and informed consent was obtained. The site of surgery was properly noted/marked. The patient was taken to the Operating Room, identified as Oscar Bird, and the procedure verified as hernia repair. A Time Out was held and the above information confirmed. ? ?The patient was placed in the supine position and underwent induction of anesthesia,  the lower abdomen and groin was prepped and draped in the standard fashion, and 0.25% Marcaine with epinephrine was used to anesthetize the skin over the mid-portion of the inguinal canal. A transverse incision was made. Dissection was carried through the soft tissue to expose the inguinal canal and inguinal ligament along its lower edge. The external oblique fascia was split along the course of its fibers, exposing the inguinal canal. The cord and nerve were looped using a Penrose drain and reflected out of the field. The ilioinguinal was divided as it exited the muscle to prevent entrapment. The indirect defect was exposed and a piece of prolene hernia system ultrapro mesh was and placed into the defect. Interupted 1-0 novafil suture was then used  to repair the defect, with the suture being sewn from the pubic tubercle inferiorly and superiorly along the canal to a level just beyond the internal ring. The mesh was split to allow passage of the cord into the canal without entrapment. The contents were then returned to canal and the external oblique fashion was then closed in a continuous fashion using 2-0 Vicryl suture taking care not to cause entrapment. Scarpa's layer closed with 3 0 vicryl and 4 0 monocryl used to close the skin.  Dermabond used for dressing. ? ?Instrument, sponge, and needle counts were correct prior to closure and at the conclusion of the case. ? ?Findings: Hernia as above ? ?Estimated Blood Loss: Minimal ?        ?Drains: None ?        ?Total IV Fluids: per OR  record  ?        ?Specimens: none  ?        ?      ?  Complications: None; patient tolerated the procedure well. ?        ?Disposition: PACU - hemodynamically stable. ?        ?Condition: stable  ?

## 2021-10-10 NOTE — Discharge Instructions (Addendum)
[facility] _______[facility] ? ?UMBILICAL OR INGUINAL HERNIA REPAIR: POST OP INSTRUCTIONS ? ?Always review your discharge instruction sheet given to you by the facility where your surgery was performed. ?IF YOU HAVE DISABILITY OR FAMILY LEAVE FORMS, YOU MUST BRING THEM TO THE OFFICE FOR PROCESSING.   ?DO NOT GIVE THEM TO YOUR DOCTOR. ? ?1. A  prescription for pain medication may be given to you upon discharge.  Take your pain medication as prescribed, if needed.  If narcotic pain medicine is not needed, then you may take acetaminophen (Tylenol) or ibuprofen (Advil) as needed. ?2. Take your usually prescribed medications unless otherwise directed. ?If you need a refill on your pain medication, please contact your pharmacy.  They will contact our office to request authorization. Prescriptions will not be filled after 5 pm or on week-ends. ?3. You should follow a light diet the first 24 hours after arrival home, such as soup and crackers, etc.  Be sure to include lots of fluids daily.  Resume your normal diet the day after surgery. ?4.Most patients will experience some swelling and bruising around the umbilicus or in the groin and scrotum.  Ice packs and reclining will help.  Swelling and bruising can take several days to resolve.  ?6. It is common to experience some constipation if taking pain medication after surgery.  Increasing fluid intake and taking a stool softener (such as Colace) will usually help or prevent this problem from occurring.  A mild laxative (Milk of Magnesia or Miralax) should be taken according to package directions if there are no bowel movements after 48 hours. ?7. Unless discharge instructions indicate otherwise, you may remove your bandages 24-48 hours after surgery, and you may shower at that time.  You may have steri-strips (small skin tapes) in place directly over the incision.  These strips should be left on the skin for 7-10 days.  If your surgeon used skin glue on the  incision, you may shower in 24 hours.  The glue will flake off over the next 2-3 weeks.  Any sutures or staples will be removed at the office during your follow-up visit. ?8. ACTIVITIES:  You may resume regular (light) daily activities beginning the next day--such as daily self-care, walking, climbing stairs--gradually increasing activities as tolerated.  You may have sexual intercourse when it is comfortable.  Refrain from any heavy lifting or straining until approved by your doctor. ? ?a.You may drive when you are no longer taking prescription pain medication, you can comfortably wear a seatbelt, and you can safely maneuver your car and apply brakes. ?b.RETURN TO WORK:   ?_____________________________________________ ? ?9.You should see your doctor in the office for a follow-up appointment approximately 2-3 weeks after your surgery.  Make sure that you call for this appointment within a day or two after you arrive home to insure a convenient appointment time. ?10.OTHER INSTRUCTIONS: _________________________ ?   _____________________________________ ? ?WHEN TO CALL YOUR DOCTOR: ?Fever over 101.0 ?Inability to urinate ?Nausea and/or vomiting ?Extreme swelling or bruising ?Continued bleeding from incision. ?Increased pain, redness, or drainage from the incision ? ?The clinic staff is available to answer your questions during regular business hours.  Please don?t hesitate to call and ask to speak to one of the nurses for clinical concerns.  If you have a medical emergency, go to the nearest emergency room or call 911.  A surgeon from [facility] is always on call at the hospital ? ? ?[address] [place],  [place], [place]  [address] ? ? [address], [place],    [address] ?[phone] ? [phone] ? FAX [phone] ?Web site: [email or web address]  ? ? ? ? ? ?Post Anesthesia Home Care Instructions ? ?Activity: ?Get plenty of rest for the remainder of the day. A responsible  individual must stay with you for 24 hours following the procedure.  ?For the next 24 hours, DO NOT: ?-Drive a car ?-Paediatric nurse ?-Drink alcoholic beverages ?-Take any medication unless instructed by your physician ?-Make any legal decisions or sign important papers. ? ?Meals: ?Start with liquid foods such as gelatin or soup. Progress to regular foods as tolerated. Avoid greasy, spicy, heavy foods. If nausea and/or vomiting occur, drink only clear liquids until the nausea and/or vomiting subsides. Call your physician if vomiting continues. ? ?Special Instructions/Symptoms: ?Your throat may feel dry or sore from the anesthesia or the breathing tube placed in your throat during surgery. If this causes discomfort, gargle with warm salt water. The discomfort should disappear within 24 hours. ? ?If you had a scopolamine patch placed behind your ear for the management of post- operative nausea and/or vomiting: ? ?1. The medication in the patch is effective for 72 hours, after which it should be removed.  Wrap patch in a tissue and discard in the trash. Wash hands thoroughly with soap and water. ?2. You may remove the patch earlier than 72 hours if you experience unpleasant side effects which may include dry mouth, dizziness or visual disturbances. ?3. Avoid touching the patch. Wash your hands with soap and water after contact with the patch. ?    ? ? ? ?Tylenol may be given at 2pm ? ?

## 2021-10-11 ENCOUNTER — Encounter (HOSPITAL_BASED_OUTPATIENT_CLINIC_OR_DEPARTMENT_OTHER): Payer: Self-pay | Admitting: Surgery

## 2022-02-07 IMAGING — CT CT CARDIAC CORONARY ARTERY CALCIUM SCORE
4 series · 12 of 20 positions shown, 13 images · non-contrast
Comparison: 03/23/2013 chest radiograph from [facility].
COMPARISON: 03/23/2013 chest radiograph from [facility].

Addendum:
EXAM:
OVER-READ INTERPRETATION  CT CHEST

The following report is an over-read performed by radiologist Dr.
Beebeejaun Rulz [REDACTED] on 09/21/2020. This over-read
does not include interpretation of cardiac or coronary anatomy or
pathology. The calcium score interpretation by the cardiologist is
attached.
CLINICAL DATA: Risk stratification
Coronary Calcium Score
TECHNIQUE: The patient was scanned on a Siemens go.Top Scanner. Axial
non-contrast 3 mm slices were carried out through the heart. The
data set was analyzed on a dedicated work station and scored using
the Agatson method.

[Series 2: sa36 calcium scoring 3.00 · axial · 0.30mm/px · z∈[-1149,-1065]mm · 3 of 56 slices shown, 4 images (1 of 2)]
[im 14/56  vessel]
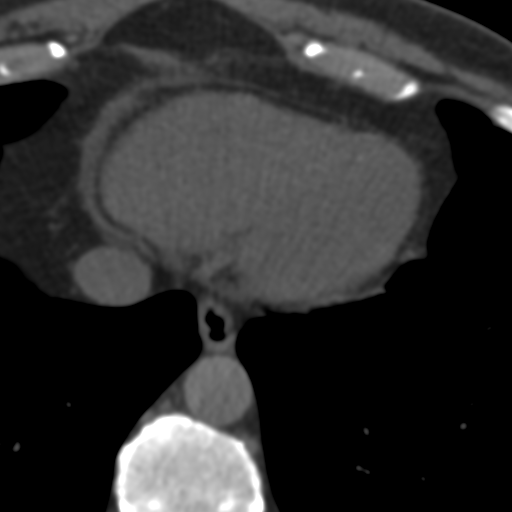
[im 14/56  lung]
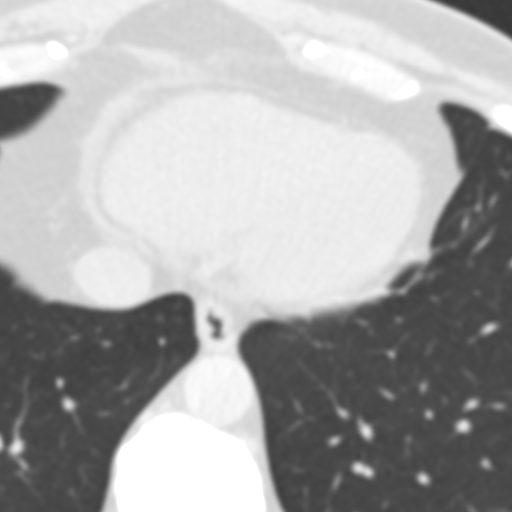
[im 28/56  vessel]
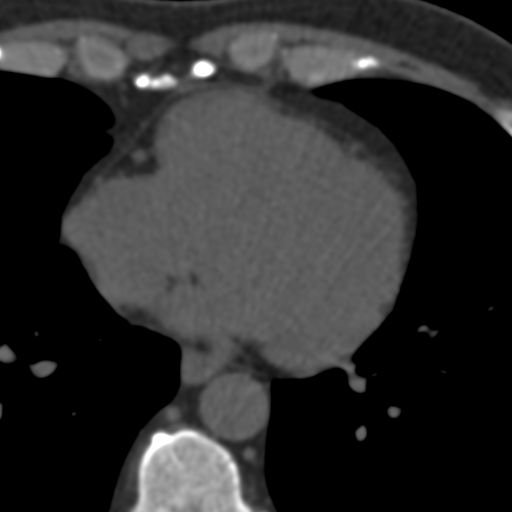
[im 42/56  vessel]
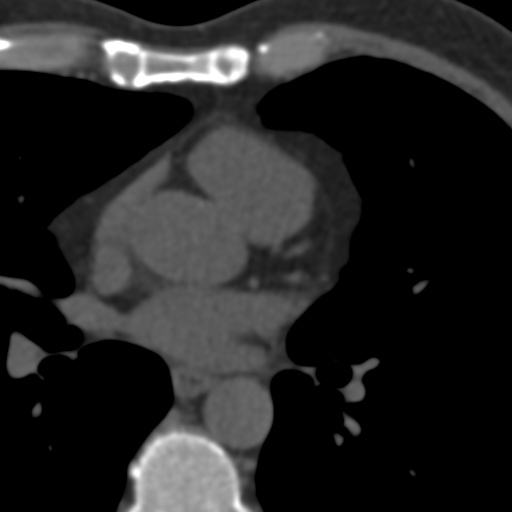

[Series 3: sa36 calcium scoring 3.00 · axial · 0.30mm/px · z∈[-1149,-1065]mm · 3 of 56 slices shown (2 of 2)]
[im 14/56  vessel]
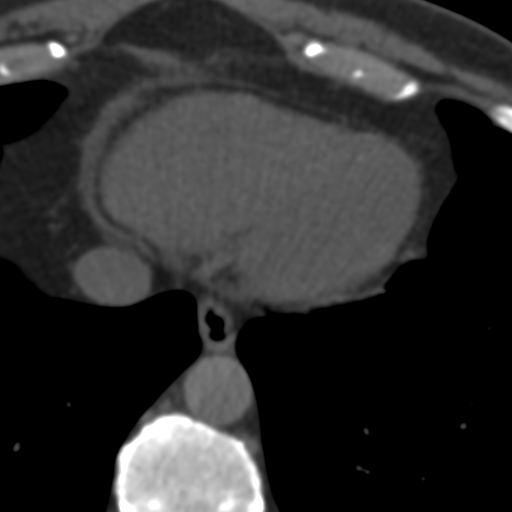
[im 28/56  vessel]
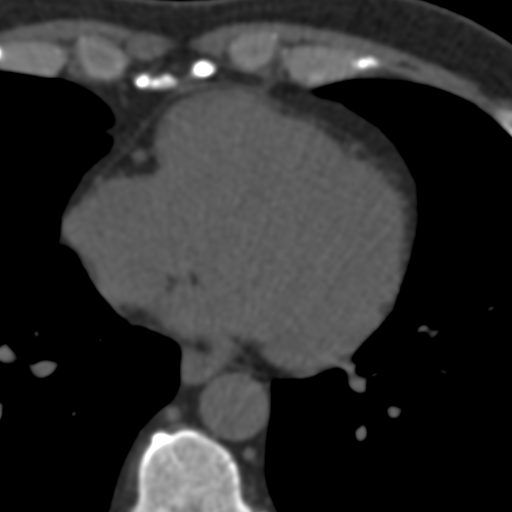
[im 42/56  vessel]
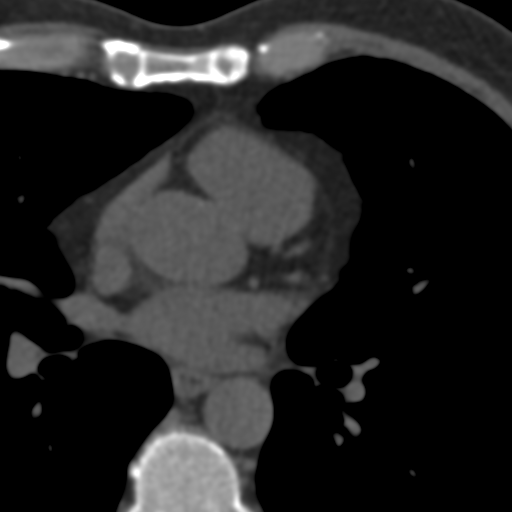

[Series 5: full fov st calcium scoring 3.00 · axial · 0.69mm/px · z∈[-1149,-1065]mm · 3 of 56 slices shown]
[im 14/56  vessel]
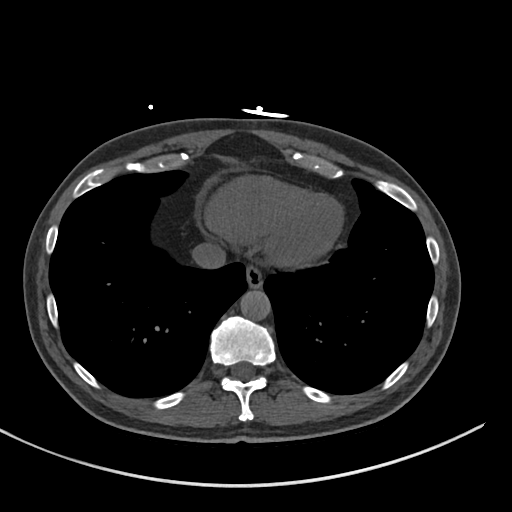
[im 28/56  vessel]
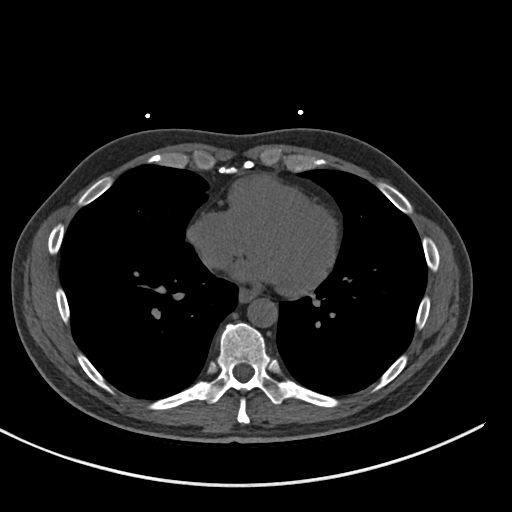
[im 42/56  vessel]
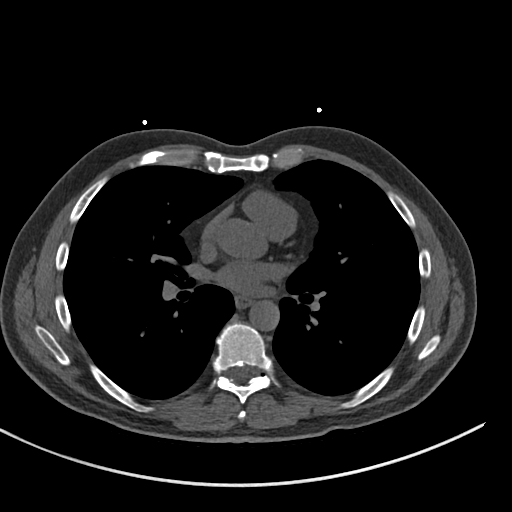

[Series 10: full fov lungs calcium scoring 3.00 ax · axial · 0.69mm/px · z∈[-1149,-1065]mm · 3 of 56 slices shown]
[im 14/56  vessel]
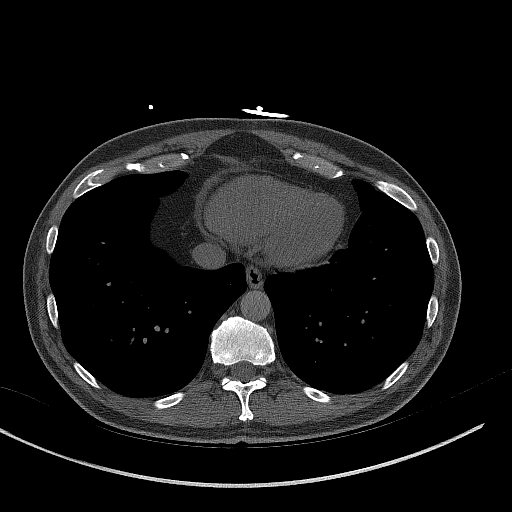
[im 28/56  vessel]
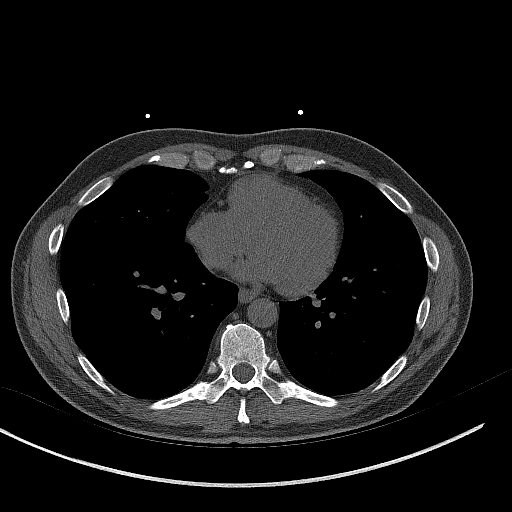
[im 42/56  vessel]
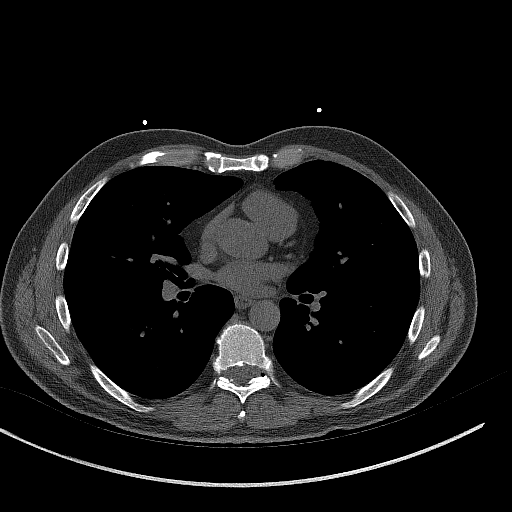

[12 of 20 positions shown; findings below may reference images not displayed]

FINDINGS: Vascular: Normal aortic caliber.

Mediastinum/Nodes: No imaged thoracic adenopathy.

Lungs/Pleura: No pleural fluid. Clear imaged lungs. Minimal
centrilobular emphysema.

Upper Abdomen: Normal imaged portions of the liver, spleen, stomach.

Musculoskeletal: No acute osseous abnormality.
IMPRESSION: No acute findings in the imaged extracardiac chest.

Emphysema (DFC6Z-EN0.E).
FINDINGS: Non-cardiac: See separate report from [REDACTED].

Ascending Aorta: Normal size

Pericardium: Normal

Coronary arteries: Normal origin of left and right coronary
arteries. Distribution of arterial calcifications if present, as
noted below;

LM 0

LAD

LCx 0

RCA 0

Total
IMPRESSION: Low coronary calcium score of 10.1. This was 28th percentile for age
and sex matched control.

Mobilis Sellam

*** End of Addendum ***
EXAM:
OVER-READ INTERPRETATION  CT CHEST

The following report is an over-read performed by radiologist Dr.
Beebeejaun Rulz [REDACTED] on 09/21/2020. This over-read
does not include interpretation of cardiac or coronary anatomy or
pathology. The calcium score interpretation by the cardiologist is
attached.
FINDINGS: Vascular: Normal aortic caliber.

Mediastinum/Nodes: No imaged thoracic adenopathy.

Lungs/Pleura: No pleural fluid. Clear imaged lungs. Minimal
centrilobular emphysema.

Upper Abdomen: Normal imaged portions of the liver, spleen, stomach.

Musculoskeletal: No acute osseous abnormality.
IMPRESSION: No acute findings in the imaged extracardiac chest.

Emphysema (DFC6Z-EN0.E).

## 2022-02-19 ENCOUNTER — Other Ambulatory Visit: Payer: Self-pay | Admitting: Surgery

## 2022-02-19 DIAGNOSIS — K4090 Unilateral inguinal hernia, without obstruction or gangrene, not specified as recurrent: Secondary | ICD-10-CM

## 2022-02-24 ENCOUNTER — Ambulatory Visit
Admission: RE | Admit: 2022-02-24 | Discharge: 2022-02-24 | Disposition: A | Discharge status: Home / Self Care | Payer: Medicare Other | Source: Ambulatory Visit | Attending: Surgery | Admitting: Surgery

## 2022-02-24 DIAGNOSIS — K4090 Unilateral inguinal hernia, without obstruction or gangrene, not specified as recurrent: Secondary | ICD-10-CM | POA: Diagnosis not present

## 2022-02-24 LAB — POCT I-STAT CREATININE: Creatinine, Ser: 1.1 mg/dL (ref 0.61–1.24)

## 2022-02-24 MED ORDER — IOHEXOL 300 MG/ML  SOLN
100.0000 mL | Freq: Once | INTRAMUSCULAR | Status: AC | PRN
  Administered 2022-02-24: 100 mL via INTRAVENOUS

## 2022-02-26 ENCOUNTER — Encounter: Payer: Self-pay | Admitting: Surgery

## 2022-06-10 ENCOUNTER — Ambulatory Visit: Payer: Medicare Other | Admitting: Family Medicine

## 2022-06-17 ENCOUNTER — Ambulatory Visit (INDEPENDENT_AMBULATORY_CARE_PROVIDER_SITE_OTHER)
Admission: RE | Admit: 2022-06-17 | Discharge: 2022-06-17 | Disposition: A | Discharge status: Home / Self Care | Payer: Medicare Other | Source: Ambulatory Visit | Attending: Family Medicine | Admitting: Family Medicine

## 2022-06-17 ENCOUNTER — Ambulatory Visit (INDEPENDENT_AMBULATORY_CARE_PROVIDER_SITE_OTHER): Payer: Medicare Other | Admitting: Family Medicine

## 2022-06-17 ENCOUNTER — Encounter: Payer: Self-pay | Admitting: Family Medicine

## 2022-06-17 VITALS — BP 150/86 | HR 117 | Temp 97.3°F | Ht 65.5 in | Wt 146.5 lb

## 2022-06-17 DIAGNOSIS — N3289 Other specified disorders of bladder: Secondary | ICD-10-CM | POA: Diagnosis not present

## 2022-06-17 DIAGNOSIS — R1031 Right lower quadrant pain: Secondary | ICD-10-CM | POA: Diagnosis not present

## 2022-06-17 DIAGNOSIS — E785 Hyperlipidemia, unspecified: Secondary | ICD-10-CM

## 2022-06-17 DIAGNOSIS — I10 Essential (primary) hypertension: Secondary | ICD-10-CM | POA: Diagnosis not present

## 2022-06-17 DIAGNOSIS — G8929 Other chronic pain: Secondary | ICD-10-CM

## 2022-06-17 HISTORY — DX: Other chronic pain: G89.29

## 2022-06-17 MED ORDER — TRAMADOL HCL 50 MG PO TABS: 25.0000 mg | ORAL_TABLET | Freq: Every evening | ORAL | 0 refills | Status: AC | PRN

## 2022-06-17 NOTE — Assessment & Plan Note (Signed)
He thinks he's on atorvastatin '20mg'$  daily - update FLP when he returns for fasting labs prior to AMW.

## 2022-06-17 NOTE — Patient Instructions (Addendum)
R hip xray today.  Pending results we may order repeat CT scan to monitor R fluid collection.  We will request record from [facility] as well as [facility].  Good to see you today. Return in 4-6 weeks for follow up visit/wellness visit.  Check BP at home, if it's staying >140/90 start amlodipine daily.

## 2022-06-17 NOTE — Assessment & Plan Note (Addendum)
Chronic longstanding for several years, ongoing despite inguinal hernia repair 5/202 although he notes surgery did help decrease pain.  On latest CT there's 1.8 x 2.6cm fluid collection between ileum and distal R external iliac artery/vein suspicious for cyst vs hematoma - has seen gen surgery in f/u. Discussed possible rpt imaging over the next month to ensure fluid collection is resolving.  CT also showed advanced DDD with collapse and retrolisthesis at L4/5.  Exam without obvious hip OA symptoms. Check baseline R hip xrays today.  Given ongoing pain and hypertension, rec back off NSAID use, instead will prescribe tramadol PRN - discussed controlled substance and habit forming nature, rec start 1/2 tab. Reassess pain when he returns for AMW.

## 2022-06-17 NOTE — Assessment & Plan Note (Signed)
Incidental finding on recent CT scan.  States had reassuring cystoscopy.  Will request records.

## 2022-06-17 NOTE — Assessment & Plan Note (Signed)
Chronic, deteriorated in setting of pain, meeting new doctor, possible component of white coat hypertension.  He regularly takes valsartan '160mg'$  daily, with PRN amlodipine. I asked him to monitor BP at home and if staying >140/90, rec start amlodipine daily.  Reassess control at f/u visit.

## 2022-06-17 NOTE — Progress Notes (Signed)
Patient ID: Oscar Bird, male    DOB: [birth year], 67 y.o.   MRN: [id number]  This visit was conducted in person.  BP (!) 150/86   Pulse (!) 117   Temp (!) 97.3 F (36.3 C) (Temporal)   Ht 5' 5.5" (1.664 m)   Wt 146 lb 8 oz (66.5 kg)   SpO2 99%   BMI 24.01 kg/m   BP Readings from Last 3 Encounters:  06/17/22 (!) 150/86  10/10/21 (!) 172/88  10/28/12 (!) 161/92  On repeat 162/86  CC: new pt to establish care, chronic R groin pain Subjective:   HPI: Oscar Bird is a 67 y.o. male presenting on 06/17/2022 for New Patient (Initial Visit)   Acquaintance of Dr Walker Kehr.  Previously saw [facility] Rex Kras then Genelle Bal NP.  Last CPE ~1 yr ago.   Chronic R groin/inguinal pain. Saw urology, PFPT, gen surgery.  Initially treated for prostatitis with levaquin course. Now off tamsulosin.  S/p R inguinal hernia repair 10/2021 ([facility]). Had post-op nausea/vomiting.  He's used ibuprofen, tylenol, lidocaine patches.  Has been told he has large prostate.   CT scan 02/2022 IMPRESSION: 1. There are small chronic inguinal fat hernias, unchanged in appearance and size, with no entrapped bowel. 2. 1.4 cm enhancing nodular lesion in the midline projecting in the posterior bladder and could be a bladder wall lesion or a nodular prostatic protrusion into the bladder. Further evaluation recommended with ultrasound, MRI or CT cystography. 3. New finding of a 1.8 x 1.1 x 2.6 cm grossly uncomplicated right lower abdominopelvic quadrant fluid collection in between a distal ileal loop and the distal right external iliac artery and vein, slightly above the right inguinal ligament. Benign entity such as a posttraumatic cyst or hematoma is favored and does not appear to communicate with the artery and vein or with the bowel. There are no surrounding inflammatory changes. Consider 3-6 months follow-up imaging study for re-characterization or to ensure stability or resolution. 4. Small chronic scrotal  hydroceles. Also showed advanced degenerative disc collapse again noted with slight retrolisthesis and with spondylosis at L4-5. Early hip DJD.  He saw urology in follow up - s/p normal cystoscopy.  He saw gen surgery in follow up - rec exploratory laparoscopy for further evaluation of above.  No back pain.   HTN - currently on amlodipine '10mg'$  daily, valsartan '160mg'$  daily.  Home BP readings better control.  Endorses h/o white coat hypertension.  Has not been as active with aerobic exercise due to above groin pain.   Lives with wife. Has 2 grown children in [place] No alcohol in the past 40 years.  Occ: retired Producer, television/film/video.  Enjoys traveling extensively - fly fishing, hiking, running. Runs 10-15 mi/wk. 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      Relevant past medical, surgical, family and social history reviewed and updated as indicated. Interim medical history since our last visit reviewed. Allergies and medications reviewed and updated. Outpatient Medications Prior to Visit  Medication Sig Dispense Refill   amLODipine (NORVASC) 10 MG tablet Take 1 tablet (10 mg total) by mouth daily.     Cholecalciferol (VITAMIN D3 PO) Take by mouth daily.     Cranberry 400 MG CAPS Take by mouth.     Cyanocobalamin (VITAMIN B12 PO) Take by mouth daily.     ibuprofen (ADVIL) 800 MG tablet Take 1 tablet (800 mg total) by mouth every 8 (eight) hours as needed. 30 tablet 0   Krill Oil 500  MG CAPS Take by mouth.     Misc Natural Products (OSTEO BI-FLEX JOINT SHIELD PO) Take by mouth.     MISC NATURAL PRODUCTS PO Take by mouth. Serra enzyme     MISC NATURAL PRODUCTS PO Take by mouth. CBC oil     MISC NATURAL PRODUCTS PO Take by mouth. Carbon 60     TAMSULOSIN HCL PO Take by mouth.     Turmeric 500 MG CAPS Take by mouth.     valsartan (DIOVAN) 160 MG tablet Take 160 mg by mouth daily.     atorvastatin (LIPITOR) 10 MG tablet Take 10 mg by mouth daily.     oxyCODONE (OXY IR/ROXICODONE) 5 MG immediate release  tablet Take 1 tablet (5 mg total) by mouth every 6 (six) hours as needed for severe pain. 15 tablet 0   atorvastatin (LIPITOR) 20 MG tablet Take 1 tablet (20 mg total) by mouth daily.     No facility-administered medications prior to visit.     Per HPI unless specifically indicated in ROS section below Review of Systems  Objective:  BP (!) 150/86   Pulse (!) 117   Temp (!) 97.3 F (36.3 C) (Temporal)   Ht 5' 5.5" (1.664 m)   Wt 146 lb 8 oz (66.5 kg)   SpO2 99%   BMI 24.01 kg/m   Wt Readings from Last 3 Encounters:  06/17/22 146 lb 8 oz (66.5 kg)  10/10/21 141 lb 5 oz (64.1 kg)  10/28/12 143 lb 6.4 oz (65 kg)      Physical Exam Vitals and nursing note reviewed.  Constitutional:      Appearance: Normal appearance. He is normal weight. He is not ill-appearing.  HENT:     Head: Normocephalic and atraumatic.     Mouth/Throat:     Mouth: Mucous membranes are moist.     Pharynx: Oropharynx is clear. No oropharyngeal exudate or posterior oropharyngeal erythema.  Eyes:     Extraocular Movements: Extraocular movements intact.     Conjunctiva/sclera: Conjunctivae normal.     Pupils: Pupils are equal, round, and reactive to light.  Neck:     Thyroid: No thyroid mass or thyromegaly.  Cardiovascular:     Rate and Rhythm: Regular rhythm. Tachycardia present.     Pulses: Normal pulses.     Heart sounds: Normal heart sounds. No murmur heard. Pulmonary:     Effort: Pulmonary effort is normal. No respiratory distress.     Breath sounds: Normal breath sounds. No wheezing or rales.  Abdominal:     General: Bowel sounds are normal. There is no distension.     Palpations: Abdomen is soft. There is no mass.     Tenderness: There is no abdominal tenderness. There is no guarding or rebound.     Hernia: No hernia is present.  Musculoskeletal:        General: No tenderness. Normal range of motion.     Cervical back: Normal range of motion and neck supple.     Right lower leg: No edema.      Left lower leg: No edema.     Comments:  Neg SLR bilaterally. No pain with int/ext rotation at hip  Lymphadenopathy:     Cervical: No cervical adenopathy.  Skin:    General: Skin is warm and dry.     Findings: No rash.  Neurological:     Mental Status: He is alert.  Psychiatric:        Mood and Affect: Mood  is anxious.        Behavior: Behavior normal.       Results for orders placed or performed during the hospital encounter of 02/24/22  I-STAT creatinine  Result Value Ref Range   Creatinine, Ser 1.10 0.61 - 1.24 mg/dL    Assessment & Plan:   Groin pain, chronic, right Assessment & Plan: Chronic longstanding for several years, ongoing despite inguinal hernia repair 5/202 although he notes surgery did help decrease pain.  On latest CT there's 1.8 x 2.6cm fluid collection between ileum and distal R external iliac artery/vein suspicious for cyst vs hematoma - has seen gen surgery in f/u. Discussed possible rpt imaging over the next month to ensure fluid collection is resolving.  CT also showed advanced DDD with collapse and retrolisthesis at L4/5.  Exam without obvious hip OA symptoms. Check baseline R hip xrays today.  Given ongoing pain and hypertension, rec back off NSAID use, instead will prescribe tramadol PRN - discussed controlled substance and habit forming nature, rec start 1/2 tab. Reassess pain when he returns for AMW.  Orders: -     DG HIP UNILAT W OR W/O PELVIS 2-3 VIEWS RIGHT; Future -     traMADol HCl; Take 0.5-1 tablets (25-50 mg total) by mouth at bedtime as needed for moderate pain.  Dispense: 30 tablet; Refill: 0  Primary hypertension Assessment & Plan: Chronic, deteriorated in setting of pain, meeting new doctor, possible component of white coat hypertension.  He regularly takes valsartan '160mg'$  daily, with PRN amlodipine. I asked him to monitor BP at home and if staying >140/90, rec start amlodipine daily.  Reassess control at f/u visit.     Hyperlipidemia, unspecified hyperlipidemia type Assessment & Plan: He thinks he's on atorvastatin '20mg'$  daily - update FLP when he returns for fasting labs prior to AMW.    Bladder mass Assessment & Plan: Incidental finding on recent CT scan.  States had reassuring cystoscopy.  Will request records.      Meds ordered this encounter  Medications   traMADol (ULTRAM) 50 MG tablet    Sig: Take 0.5-1 tablets (25-50 mg total) by mouth at bedtime as needed for moderate pain.    Dispense:  30 tablet    Refill:  0   Orders Placed This Encounter  Procedures   DG Hip Unilat W OR W/O Pelvis 2-3 Views Right    Standing Status:   Future    Number of Occurrences:   1    Standing Expiration Date:   06/18/2023    Order Specific Question:   Reason for Exam (SYMPTOM  OR DIAGNOSIS REQUIRED)    Answer:   chronic right groin pain    Order Specific Question:   Preferred imaging location?    Answer:   [facility]     Patient Instructions  R hip xray today.  Pending results we may order repeat CT scan to monitor R fluid collection.  We will request record from [facility] as well as [facility].  Good to see you today. Return in 4-6 weeks for follow up visit/wellness visit.  Check BP at home, if it's staying >140/90 start amlodipine daily.   Follow up plan: Return in about 6 weeks (around 07/29/2022) for medicare wellness visit, follow up visit .  Ria Bush, MD

## 2022-07-20 ENCOUNTER — Other Ambulatory Visit: Payer: Self-pay | Admitting: Family Medicine

## 2022-07-20 DIAGNOSIS — E785 Hyperlipidemia, unspecified: Secondary | ICD-10-CM

## 2022-07-20 DIAGNOSIS — Z1159 Encounter for screening for other viral diseases: Secondary | ICD-10-CM

## 2022-07-20 DIAGNOSIS — Z125 Encounter for screening for malignant neoplasm of prostate: Secondary | ICD-10-CM

## 2022-07-25 ENCOUNTER — Other Ambulatory Visit (INDEPENDENT_AMBULATORY_CARE_PROVIDER_SITE_OTHER): Payer: Medicare Other

## 2022-07-25 DIAGNOSIS — Z125 Encounter for screening for malignant neoplasm of prostate: Secondary | ICD-10-CM | POA: Diagnosis not present

## 2022-07-25 DIAGNOSIS — E785 Hyperlipidemia, unspecified: Secondary | ICD-10-CM | POA: Diagnosis not present

## 2022-07-25 DIAGNOSIS — Z1159 Encounter for screening for other viral diseases: Secondary | ICD-10-CM

## 2022-07-25 LAB — LIPID PANEL
Cholesterol: 181 mg/dL (ref 0–200)
HDL: 56.3 mg/dL (ref >39.00)
LDL Cholesterol: 98 mg/dL (ref 0–99)
NonHDL: 124.4
Total CHOL/HDL Ratio: 3
Triglycerides: 134 mg/dL (ref 0.0–149.0)
VLDL: 26.8 mg/dL (ref 0.0–40.0)

## 2022-07-25 LAB — IBC PANEL
Iron: 134 ug/dL (ref 42–165)
Saturation Ratios: 41.6 % (ref 20.0–50.0)
TIBC: 322 ug/dL (ref 250.0–450.0)
Transferrin: 230 mg/dL (ref 212.0–360.0)

## 2022-07-25 LAB — CBC WITH DIFFERENTIAL/PLATELET
Basophils Absolute: 0.1 10*3/uL (ref 0.0–0.1)
Basophils Relative: 0.6 % (ref 0.0–3.0)
Eosinophils Absolute: 0.1 10*3/uL (ref 0.0–0.7)
Eosinophils Relative: 0.9 % (ref 0.0–5.0)
HCT: 52.4 % — ABNORMAL HIGH (ref 39.0–52.0)
Hemoglobin: 17.4 g/dL — ABNORMAL HIGH (ref 13.0–17.0)
Lymphocytes Relative: 13.1 % (ref 12.0–46.0)
Lymphs Abs: 1.1 10*3/uL (ref 0.7–4.0)
MCHC: 33.2 g/dL (ref 30.0–36.0)
MCV: 93.9 fl (ref 78.0–100.0)
Monocytes Absolute: 0.6 10*3/uL (ref 0.1–1.0)
Monocytes Relative: 7.4 % (ref 3.0–12.0)
Neutro Abs: 6.8 10*3/uL (ref 1.4–7.7)
Neutrophils Relative %: 78 % — ABNORMAL HIGH (ref 43.0–77.0)
Platelets: 307 10*3/uL (ref 150.0–400.0)
RBC: 5.59 Mil/uL (ref 4.22–5.81)
RDW: 14.4 % (ref 11.5–15.5)
WBC: 8.8 10*3/uL (ref 4.0–10.5)

## 2022-07-25 LAB — COMPREHENSIVE METABOLIC PANEL
ALT: 19 U/L (ref 0–53)
AST: 16 U/L (ref 0–37)
Albumin: 4.5 g/dL (ref 3.5–5.2)
Alkaline Phosphatase: 64 U/L (ref 39–117)
BUN: 25 mg/dL — ABNORMAL HIGH (ref 6–23)
CO2: 30 mEq/L (ref 19–32)
Calcium: 10.1 mg/dL (ref 8.4–10.5)
Chloride: 103 mEq/L (ref 96–112)
Creatinine, Ser: 1.11 mg/dL (ref 0.40–1.50)
GFR: 68.58 mL/min (ref >60.00)
Glucose, Bld: 107 mg/dL — ABNORMAL HIGH (ref 70–99)
Potassium: 4.8 mEq/L (ref 3.5–5.1)
Sodium: 141 mEq/L (ref 135–145)
Total Bilirubin: 0.6 mg/dL (ref 0.2–1.2)
Total Protein: 7.4 g/dL (ref 6.0–8.3)

## 2022-07-25 LAB — PSA, MEDICARE: PSA: 4.64 ng/ml — ABNORMAL HIGH (ref 0.10–4.00)

## 2022-07-25 LAB — FERRITIN: Ferritin: 92 ng/mL (ref 22.0–322.0)

## 2022-07-28 LAB — HEPATITIS C ANTIBODY: Hepatitis C Ab: NONREACTIVE

## 2022-08-01 ENCOUNTER — Encounter: Payer: Self-pay | Admitting: Family Medicine

## 2022-08-01 ENCOUNTER — Telehealth: Payer: Self-pay

## 2022-08-01 ENCOUNTER — Ambulatory Visit (INDEPENDENT_AMBULATORY_CARE_PROVIDER_SITE_OTHER): Payer: Medicare Other | Admitting: Family Medicine

## 2022-08-01 VITALS — BP 144/82 | HR 90 | Temp 97.4°F | Ht 65.0 in | Wt 148.1 lb

## 2022-08-01 DIAGNOSIS — I10 Essential (primary) hypertension: Secondary | ICD-10-CM

## 2022-08-01 DIAGNOSIS — G8929 Other chronic pain: Secondary | ICD-10-CM

## 2022-08-01 DIAGNOSIS — Z0000 Encounter for general adult medical examination without abnormal findings: Secondary | ICD-10-CM

## 2022-08-01 DIAGNOSIS — R1031 Right lower quadrant pain: Secondary | ICD-10-CM | POA: Diagnosis not present

## 2022-08-01 DIAGNOSIS — E785 Hyperlipidemia, unspecified: Secondary | ICD-10-CM | POA: Diagnosis not present

## 2022-08-01 DIAGNOSIS — N3289 Other specified disorders of bladder: Secondary | ICD-10-CM

## 2022-08-01 DIAGNOSIS — Z7189 Other specified counseling: Secondary | ICD-10-CM | POA: Insufficient documentation

## 2022-08-01 DIAGNOSIS — D751 Secondary polycythemia: Secondary | ICD-10-CM

## 2022-08-01 MED ORDER — AMLODIPINE BESYLATE 5 MG PO TABS: 5.0000 mg | ORAL_TABLET | Freq: Every day | ORAL | 4 refills | Status: DC

## 2022-08-01 MED ORDER — METOPROLOL SUCCINATE ER 25 MG PO TB24: 25.0000 mg | ORAL_TABLET | Freq: Every day | ORAL | 4 refills | Status: DC

## 2022-08-01 MED ORDER — VALSARTAN 160 MG PO TABS: 160.0000 mg | ORAL_TABLET | Freq: Two times a day (BID) | ORAL | 4 refills | Status: DC

## 2022-08-01 MED ORDER — ATORVASTATIN CALCIUM 10 MG PO TABS: 10.0000 mg | ORAL_TABLET | Freq: Every day | ORAL | 4 refills | Status: DC

## 2022-08-01 NOTE — Assessment & Plan Note (Signed)
Advanced directive discussion - has this at home - working on updating. Wife is HCPOA.

## 2022-08-01 NOTE — Telephone Encounter (Signed)
Lvm asking pt to call back. Need to know what [facility] location did he have his colonoscopy in 2019?Marland Kitchen

## 2022-08-01 NOTE — Assessment & Plan Note (Signed)
Chronic, stable on atorvastatin '10mg'$  daily - continue. The 10-year ASCVD risk score (Arnett DK, et al., 2019) is: 18.3%   Values used to calculate the score:     Age: 67 years     Sex: Male     Is Non-Hispanic African American: No     Diabetic: No     Tobacco smoker: No     Systolic Blood Pressure: 123456 mmHg     Is BP treated: Yes     HDL Cholesterol: 56.3 mg/dL     Total Cholesterol: 181 mg/dL

## 2022-08-01 NOTE — Assessment & Plan Note (Signed)

## 2022-08-01 NOTE — Patient Instructions (Addendum)
Stop amlodipine. Continue valsartan '160mg'$  and atorvastatin '10mg'$  daily.  Start metoprolol XL '25mg'$  daily.  Sign release form from [facility] for recent workup Oscar Bird).  We will also request colonoscopy 2019 from [facility] [place] copy of your immunizations.  Return in 2 months for follow up visit.

## 2022-08-01 NOTE — Assessment & Plan Note (Signed)
Ongoing, albeit improving since starting working with physical therapist through [facility] in [place].  Will continue this, with low threshold to re-image pelvic fluid collection if ongoing/worsening RLQ discomfort.  R hip xrays reassuringly showing only mild degenerative arthritis. Tramadol was ineffective for pain - will stay off this.  He asks about Celebrex - will hold for now.

## 2022-08-01 NOTE — Telephone Encounter (Signed)
Patient called in and stated that he received his last one at the [address] location in [place].

## 2022-08-01 NOTE — Progress Notes (Signed)
Patient ID: Oscar Bird, male    DOB: Apr 04, 1956, 67 y.o.   MRN: [id number]  This visit was conducted in person.  BP (!) 144/82   Pulse 90   Temp (!) 97.4 F (36.3 C) (Temporal)   Ht '5\' 5"'$  (1.651 m)   Wt 148 lb 2 oz (67.2 kg)   SpO2 97%   BMI 24.65 kg/m   BP Readings from Last 3 Encounters:  08/01/22 (!) 144/82  06/17/22 (!) 150/86  10/10/21 (!) 172/88  See below for home readings.  CC: AMW Subjective:   HPI: Oscar Bird is a 67 y.o. male presenting on 08/01/2022 for Medicare Wellness (Provided copy [to keep] of recent BP readings.)   Did not see health advisor this year.   Hearing Screening   '500Hz'$  '1000Hz'$  '2000Hz'$  '4000Hz'$   Right ear 40 20 20 0  Left ear 40 25 20 40  Comments: C/o tinnitus  Vision Screening - Comments:: Last eye exam, 07/2022.  [place] Office Visit from 08/01/2022 in [place] at [place]  PHQ-2 Total Score 1          08/01/2022   11:30 AM  [place] in the past year? 0    Chronic groin pain - ongoing pain despite inguinal hernia repair 10/2021 (albeit improved after surgery). 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 Hip xrays overall unrevealing (mild DJD). Last visit we started tramadol for pain - this didn't really help. Describes gnawing twisting pain. Saw Dr Karsten Ro urology s/p cystectomy.   Had PT through urology without much benefit.  He's regularly seeing Roxy Manns PT at [facility] in [place].   BP remaining mildly elevated despite backing off NSAID use. He continues valsartan '160mg'$  daily, started amlodipine '10mg'$  daily regularly 07/08/2022 - brings detailed log showing BP ranging from 90-140/70-90s, HR 70-100s.   Preventative: Colonoscopy 2019 WNL per pt report Oscar Bird) Prostate cancer screening - last PSA 1.2 (2021), most recent  4.2.  Lung cancer screening - not eligible Flu shot - did not get COVID shot - x4 Tetanus shot - unsure Pneumonia shot - unsure - will check with pharmacy Shingrix - completed Advanced directive discussion - has this at home - working on updating. Wife is HCPOA.  Seat belt use discussed Sunscreen use discussed. No changing moles on skin.  Ex smoker - quit 1975  Alcohol - none Dentist - q6 mo Eye exam - yearly Bowel - no constipation Bladder -  no incontinence  Lives with wife. Has 2 grown children in [place] No alcohol in the past 40 years.  Occ: retired Producer, television/film/video.  Enjoys traveling extensively - fly fishing, hiking, running. Runs 10-15 mi/wk.      Relevant past medical, surgical, family and social history reviewed and updated as indicated. Interim medical history since our last visit reviewed. Allergies and medications reviewed and updated. Outpatient Medications Prior to Visit  Medication Sig Dispense Refill   Cholecalciferol (VITAMIN D3 PO) Take by mouth daily.     Cranberry 400 MG CAPS Take by mouth.     Cyanocobalamin (VITAMIN B12 PO) Take by mouth daily.     Krill Oil 500 MG CAPS Take by mouth.     Misc Natural Products (OSTEO BI-FLEX JOINT SHIELD PO) Take by mouth.  MISC NATURAL PRODUCTS PO Take by mouth. Serra enzyme     MISC NATURAL PRODUCTS PO Take by mouth. CBC oil     MISC NATURAL PRODUCTS PO Take by mouth. Carbon 60     Turmeric 500 MG CAPS Take by mouth.     amLODipine (NORVASC) 10 MG tablet Take 1 tablet (10 mg total) by mouth daily.     atorvastatin (LIPITOR) 10 MG tablet Take 10 mg by mouth daily.     valsartan (DIOVAN) 160 MG tablet Take 160 mg by mouth 2 (two) times daily.     atorvastatin (LIPITOR) 20 MG tablet Take 1 tablet (20 mg total) by mouth daily.     ibuprofen (ADVIL) 800 MG tablet Take 1 tablet (800 mg total) by mouth every 8 (eight) hours as needed. 30 tablet 0   TAMSULOSIN HCL PO Take by mouth.     No facility-administered  medications prior to visit.     Per HPI unless specifically indicated in ROS section below Review of Systems  Objective:  BP (!) 144/82   Pulse 90   Temp (!) 97.4 F (36.3 C) (Temporal)   Ht '5\' 5"'$  (1.651 m)   Wt 148 lb 2 oz (67.2 kg)   SpO2 97%   BMI 24.65 kg/m   Wt Readings from Last 3 Encounters:  08/01/22 148 lb 2 oz (67.2 kg)  06/17/22 146 lb 8 oz (66.5 kg)  10/10/21 141 lb 5 oz (64.1 kg)      Physical Exam Vitals and nursing note reviewed.  Constitutional:      General: He is not in acute distress.    Appearance: Normal appearance. He is well-developed. He is not ill-appearing.  HENT:     Head: Normocephalic and atraumatic.     Right Ear: Hearing, tympanic membrane, ear canal and external ear normal.     Left Ear: Hearing, tympanic membrane, ear canal and external ear normal.     Mouth/Throat:     Comments: Wearing mask Eyes:     General: No scleral icterus.    Extraocular Movements: Extraocular movements intact.     Conjunctiva/sclera: Conjunctivae normal.     Pupils: Pupils are equal, round, and reactive to light.  Neck:     Thyroid: No thyroid mass or thyromegaly.     Vascular: No carotid bruit.  Cardiovascular:     Rate and Rhythm: Normal rate and regular rhythm.     Pulses: Normal pulses.          Radial pulses are 2+ on the right side and 2+ on the left side.     Heart sounds: Normal heart sounds. No murmur heard. Pulmonary:     Effort: Pulmonary effort is normal. No respiratory distress.     Breath sounds: Normal breath sounds. No wheezing, rhonchi or rales.  Abdominal:     General: Bowel sounds are normal. There is no distension.     Palpations: Abdomen is soft. There is no mass.     Tenderness: There is no abdominal tenderness. There is no guarding or rebound.     Hernia: No hernia is present.  Musculoskeletal:        General: Normal range of motion.     Cervical back: Normal range of motion and neck supple.     Right lower leg: No edema.      Left lower leg: No edema.  Lymphadenopathy:     Cervical: No cervical adenopathy.  Skin:    General: Skin is warm  and dry.     Findings: No rash.  Neurological:     General: No focal deficit present.     Mental Status: He is alert and oriented to person, place, and time.     Comments:  Recall 1/3, 3/3 with cue  Calculation 5/5 serial 3s  Psychiatric:        Mood and Affect: Mood normal.        Behavior: Behavior normal.        Thought Content: Thought content normal.        Judgment: Judgment normal.       Results for orders placed or performed in visit on 07/25/22  IBC panel  Result Value Ref Range   Iron 134 42 - 165 ug/dL   Transferrin 230.0 212.0 - 360.0 mg/dL   Saturation Ratios 41.6 20.0 - 50.0 %   TIBC 322.0 250.0 - 450.0 mcg/dL  PSA, Medicare  Result Value Ref Range   PSA 4.64 (H) 0.10 - 4.00 ng/ml  Ferritin  Result Value Ref Range   Ferritin 92.0 22.0 - 322.0 ng/mL  CBC with Differential/Platelet  Result Value Ref Range   WBC 8.8 4.0 - 10.5 K/uL   RBC 5.59 4.22 - 5.81 Mil/uL   Hemoglobin 17.4 (H) 13.0 - 17.0 g/dL   HCT 52.4 (H) 39.0 - 52.0 %   MCV 93.9 78.0 - 100.0 fl   MCHC 33.2 30.0 - 36.0 g/dL   RDW 14.4 11.5 - 15.5 %   Platelets 307.0 150.0 - 400.0 K/uL   Neutrophils Relative % 78.0 (H) 43.0 - 77.0 %   Lymphocytes Relative 13.1 12.0 - 46.0 %   Monocytes Relative 7.4 3.0 - 12.0 %   Eosinophils Relative 0.9 0.0 - 5.0 %   Basophils Relative 0.6 0.0 - 3.0 %   Neutro Abs 6.8 1.4 - 7.7 K/uL   Lymphs Abs 1.1 0.7 - 4.0 K/uL   Monocytes Absolute 0.6 0.1 - 1.0 K/uL   Eosinophils Absolute 0.1 0.0 - 0.7 K/uL   Basophils Absolute 0.1 0.0 - 0.1 K/uL  Hepatitis C antibody  Result Value Ref Range   Hepatitis C Ab NON-REACTIVE NON-REACTIVE  Comprehensive metabolic panel  Result Value Ref Range   Sodium 141 135 - 145 mEq/L   Potassium 4.8 3.5 - 5.1 mEq/L   Chloride 103 96 - 112 mEq/L   CO2 30 19 - 32 mEq/L   Glucose, Bld 107 (H) 70 - 99 mg/dL   BUN 25 (H) 6 -  23 mg/dL   Creatinine, Ser 1.11 0.40 - 1.50 mg/dL   Total Bilirubin 0.6 0.2 - 1.2 mg/dL   Alkaline Phosphatase 64 39 - 117 U/L   AST 16 0 - 37 U/L   ALT 19 0 - 53 U/L   Total Protein 7.4 6.0 - 8.3 g/dL   Albumin 4.5 3.5 - 5.2 g/dL   GFR 68.58 >60.00 mL/min   Calcium 10.1 8.4 - 10.5 mg/dL  Lipid panel  Result Value Ref Range   Cholesterol 181 0 - 200 mg/dL   Triglycerides 134.0 0.0 - 149.0 mg/dL   HDL 56.30 >39.00 mg/dL   VLDL 26.8 0.0 - 40.0 mg/dL   LDL Cholesterol 98 0 - 99 mg/dL   Total CHOL/HDL Ratio 3    NonHDL 124.40       08/01/2022   11:30 AM 06/17/2022   10:11 AM  Depression screen PHQ 2/9  Decreased Interest 0 1  Down, Depressed, Hopeless 1 1  PHQ - 2 Score  1 2  Altered sleeping 0 2  Tired, decreased energy 0 2  Change in appetite 0 0  Feeling bad or failure about yourself  0 1  Trouble concentrating 0 1  Moving slowly or fidgety/restless 0 0  Suicidal thoughts 0 0  PHQ-9 Score 1 8  Difficult doing work/chores Not difficult at all Somewhat difficult       08/01/2022   11:30 AM 06/17/2022   10:11 AM  GAD 7 : Generalized Anxiety Score  Nervous, Anxious, on Edge 1 1  Control/stop worrying 0 1  Worry too much - different things 0 1  Trouble relaxing 0 1  Restless 0 2  Easily annoyed or irritable 0 1  Afraid - awful might happen 0 2  Total GAD 7 Score 1 9  Anxiety Difficulty Not difficult at all Somewhat difficult   Assessment & Plan:  H/o hemochromatosis dx in chart - iron levels normal - will resolve as likely error.   Problem List Items Addressed This Visit     Medicare annual wellness visit, subsequent - Primary (Chronic)    I have personally reviewed the Medicare Annual Wellness questionnaire and have noted 1. The patient's medical and social history 2. Their use of alcohol, tobacco or illicit drugs 3. Their current medications and supplements 4. The patient's functional ability including ADL's, fall risks, home safety risks and hearing or visual  impairment. Cognitive function has been assessed and addressed as indicated.  5. Diet and physical activity 6. Evidence for depression or mood disorders The patients weight, height, BMI have been recorded in the chart. I have made referrals, counseling and provided education to the patient based on review of the above and I have provided the pt with a written personalized care plan for preventive services. Provider list updated.. See scanned questionairre as needed for further documentation. Reviewed preventative protocols and updated unless pt declined.       HTN (hypertension)    Chronic, overall great readings based on detailed home log he's been tracking, will send for scanning. Some low readings especially noted after a run - down to A999333 systolic. For this reason, recommended holding amlodipine - instead will start beta adrenergic blocker Toprol XL '25mg'$  daily as he seems to have more adrenergic state at baseline.       Relevant Medications   atorvastatin (LIPITOR) 10 MG tablet   valsartan (DIOVAN) 160 MG tablet   metoprolol succinate (TOPROL-XL) 25 MG 24 hr tablet   Hyperlipidemia    Chronic, stable on atorvastatin '10mg'$  daily - continue. The 10-year ASCVD risk score (Arnett DK, et al., 2019) is: 18.3%   Values used to calculate the score:     Age: 9 years     Sex: Male     Is Non-Hispanic African American: No     Diabetic: No     Tobacco smoker: No     Systolic Blood Pressure: 123456 mmHg     Is BP treated: Yes     HDL Cholesterol: 56.3 mg/dL     Total Cholesterol: 181 mg/dL       Relevant Medications   atorvastatin (LIPITOR) 10 MG tablet   valsartan (DIOVAN) 160 MG tablet   metoprolol succinate (TOPROL-XL) 25 MG 24 hr tablet   Groin pain, chronic, right    Ongoing, albeit improving since starting working with physical therapist through [facility] in [place].  Will continue this, with low threshold to re-image pelvic fluid collection if ongoing/worsening RLQ  discomfort.  R  hip xrays reassuringly showing only mild degenerative arthritis. Tramadol was ineffective for pain - will stay off this.  He asks about Celebrex - will hold for now.       Bladder mass    Per pt had reassuring urological workup including cystoscopy Louis Meckel) - records requested today.       Advanced directives, counseling/discussion    Advanced directive discussion - has this at home - working on updating. Wife is HCPOA.       Polycythemia    Longstanding issue.  He states he had reassuring hematology evaluation remotely, also tested negative for sleep apnea. Will continue to monitor.         Meds ordered this encounter  Medications   DISCONTD: amLODipine (NORVASC) 5 MG tablet    Sig: Take 1 tablet (5 mg total) by mouth daily.    Dispense:  90 tablet    Refill:  4    Note new dose   atorvastatin (LIPITOR) 10 MG tablet    Sig: Take 1 tablet (10 mg total) by mouth daily.    Dispense:  90 tablet    Refill:  4   valsartan (DIOVAN) 160 MG tablet    Sig: Take 1 tablet (160 mg total) by mouth 2 (two) times daily.    Dispense:  90 tablet    Refill:  4   metoprolol succinate (TOPROL-XL) 25 MG 24 hr tablet    Sig: Take 1 tablet (25 mg total) by mouth daily.    Dispense:  90 tablet    Refill:  4    To replace amlodipine    No orders of the defined types were placed in this encounter.   Patient Instructions  Stop amlodipine. Continue valsartan '160mg'$  and atorvastatin '10mg'$  daily.  Start metoprolol XL '25mg'$  daily.  Sign release form from [facility] for recent workup Louis Meckel).  We will also request colonoscopy 2019 from [facility] [place] copy of your immunizations.  Return in 2 months for follow up visit.   Follow up plan: Return in about 2 months (around 09/30/2022), or if symptoms worsen or fail to improve, for follow up visit.  Ria Bush, MD

## 2022-08-01 NOTE — Assessment & Plan Note (Signed)
Per pt had reassuring urological workup including cystoscopy Louis Meckel) - records requested today.

## 2022-08-01 NOTE — Assessment & Plan Note (Signed)
Chronic, overall great readings based on detailed home log he's been tracking, will send for scanning. Some low readings especially noted after a run - down to A999333 systolic. For this reason, recommended holding amlodipine - instead will start beta adrenergic blocker Toprol XL '25mg'$  daily as he seems to have more adrenergic state at baseline.

## 2022-08-01 NOTE — Telephone Encounter (Signed)
Noted. Faxed request to [facility] location at [phone].

## 2022-08-01 NOTE — Assessment & Plan Note (Signed)
Longstanding issue.  He states he had reassuring hematology evaluation remotely, also tested negative for sleep apnea. Will continue to monitor.

## 2022-08-14 ENCOUNTER — Other Ambulatory Visit: Payer: Self-pay | Admitting: Surgery

## 2022-08-14 ENCOUNTER — Encounter: Payer: Self-pay | Admitting: Family Medicine

## 2022-08-14 DIAGNOSIS — R1031 Right lower quadrant pain: Secondary | ICD-10-CM

## 2022-08-21 ENCOUNTER — Ambulatory Visit
Admission: RE | Admit: 2022-08-21 | Discharge: 2022-08-21 | Disposition: A | Discharge status: Home / Self Care | Payer: Medicare Other | Source: Ambulatory Visit | Attending: Surgery | Admitting: Surgery

## 2022-08-21 DIAGNOSIS — R1031 Right lower quadrant pain: Secondary | ICD-10-CM

## 2022-08-21 MED ORDER — IOPAMIDOL (ISOVUE-300) INJECTION 61%
100.0000 mL | Freq: Once | INTRAVENOUS | Status: AC | PRN
  Administered 2022-08-21: 100 mL via INTRAVENOUS

## 2022-08-26 ENCOUNTER — Encounter: Payer: Self-pay | Admitting: Surgery

## 2022-09-04 ENCOUNTER — Telehealth: Payer: Self-pay | Admitting: Family Medicine

## 2022-09-04 NOTE — Telephone Encounter (Signed)
Lvm asking pt to call back. Need more details concerning request from eye doc. Pt will need to schedule OV.

## 2022-09-04 NOTE — Telephone Encounter (Signed)
Patient called in and stated that his eye doctor would like for him to have a thyroid workup. Please advise. Thank you!

## 2022-09-04 NOTE — Telephone Encounter (Signed)
Pt rtn call. 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 They recommend thyroid labs. Pt scheduled OV on 09/10/22 at 8:00. Pt will bring copy of visit notes.

## 2022-09-10 ENCOUNTER — Ambulatory Visit (INDEPENDENT_AMBULATORY_CARE_PROVIDER_SITE_OTHER): Payer: Medicare Other | Admitting: Family Medicine

## 2022-09-10 ENCOUNTER — Encounter: Payer: Self-pay | Admitting: Family Medicine

## 2022-09-10 VITALS — BP 150/98 | HR 88 | Temp 97.4°F | Ht 65.0 in | Wt 148.2 lb

## 2022-09-10 DIAGNOSIS — I10 Essential (primary) hypertension: Secondary | ICD-10-CM | POA: Diagnosis not present

## 2022-09-10 DIAGNOSIS — H02539 Eyelid retraction unspecified eye, unspecified lid: Secondary | ICD-10-CM

## 2022-09-10 LAB — TSH: TSH: 1.45 u[IU]/mL (ref 0.35–5.50)

## 2022-09-10 LAB — T4, FREE: Free T4: 0.82 ng/dL (ref 0.60–1.60)

## 2022-09-10 NOTE — Patient Instructions (Addendum)
Labs today to for evaluate thyroid disease/thyroid function - we will be in touch with result.  Continue current medicines of metoprolol and valsartan.

## 2022-09-10 NOTE — Assessment & Plan Note (Addendum)
Reviewed recent eye doctor note - concern for thyroid disease given bilateral lid lag/retraction noted. He denies significant hyper or hypothyroid symptoms. Will further evaluate with thyroid testing today - TSH, fT4, T3, anti-TPO, anti-TSHr. Discussed possible endo referral if testing consistent with hyperthyroidism. Continue BB for now.

## 2022-09-10 NOTE — Progress Notes (Signed)
Patient ID: Oscar Bird, male    DOB: 1955/11/06, 67 y.o.   MRN: [id number]  This visit was conducted in person.  BP (!) 150/98 (BP Location: Right Arm, Cuff Size: Normal)   Pulse 88   Temp (!) 97.4 F (36.3 C) (Temporal)   Ht 5\' 5"  (1.651 m)   Wt 148 lb 4 oz (67.2 kg)   SpO2 99%   BMI 24.67 kg/m    CC: discuss eye trouble Subjective:   HPI: Oscar Bird is a 67 y.o. 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 They recommend thyroid labs.)   Recently saw Retina specialist Dr Zadie Rhine for post-op follow up after surgery for right eye epiretinal membrane. Incidentally also found bilateral lid retraction/lag concerning for thyroid related eye disease. Requests further thyroid labs today.   Denies heat or cold intolerance, diarrhea/constipation, skin or hair changes, fatigue.  Notes poor sleep.  Notes he's 8 lbs heavier than normal but also less active.   He continues metoprolol succination 25mg  daily and valsartan 160mg  daily. Last visit we changed from amlodipine to Toprol XL.  Marked hypertension in office today however brings BP log showing BP at home ranging from 101-140/70-90s, HR 60-70s. Peak BP at home 151/94 on 09/09/2022.  Notes increased allergic rhinitis.      Relevant past medical, surgical, family and social history reviewed and updated as indicated. Interim medical history since our last visit reviewed. Allergies and medications reviewed and updated. Outpatient Medications Prior to Visit  Medication Sig Dispense Refill   atorvastatin (LIPITOR) 10 MG tablet Take 1 tablet (10 mg total) by mouth daily. 90 tablet 4   Cholecalciferol (VITAMIN D3 PO) Take by mouth daily.     Cyanocobalamin (VITAMIN B12 PO) Take by mouth daily.     Krill Oil 500 MG CAPS Take by mouth.     metoprolol succinate (TOPROL-XL) 25 MG 24 hr tablet Take 1 tablet (25 mg total) by mouth daily.  90 tablet 4   Misc Natural Products (OSTEO BI-FLEX JOINT SHIELD PO) Take by mouth.     MISC NATURAL PRODUCTS PO Take by mouth. Serra enzyme     Turmeric 500 MG CAPS Take by mouth.     valsartan (DIOVAN) 160 MG tablet Take 1 tablet (160 mg total) by mouth 2 (two) times daily. 90 tablet 4   Cranberry 400 MG CAPS Take by mouth.     MISC NATURAL PRODUCTS PO Take by mouth. CBC oil     MISC NATURAL PRODUCTS PO Take by mouth. Carbon 60     No facility-administered medications prior to visit.     Per HPI unless specifically indicated in ROS section below Review of Systems  Objective:  BP (!) 150/98 (BP Location: Right Arm, Cuff Size: Normal)   Pulse 88   Temp (!) 97.4 F (36.3 C) (Temporal)   Ht 5\' 5"  (1.651 m)   Wt 148 lb 4 oz (67.2 kg)   SpO2 99%   BMI 24.67 kg/m   Wt Readings from Last 3 Encounters:  09/10/22 148 lb 4 oz (67.2 kg)  08/01/22 148 lb 2 oz (67.2 kg)  06/17/22 146 lb 8 oz (66.5 kg)      Physical Exam Vitals and nursing note reviewed.  Constitutional:      Appearance: Normal appearance. He is not ill-appearing.  Eyes:     Extraocular Movements: Extraocular movements intact.  Pupils: Pupils are equal, round, and reactive to light.  Neck:     Thyroid: Thyromegaly (possible right sided) present. No thyroid mass or thyroid tenderness.  Cardiovascular:     Rate and Rhythm: Normal rate and regular rhythm.     Pulses: Normal pulses.     Heart sounds: Normal heart sounds. No murmur heard. Pulmonary:     Effort: Pulmonary effort is normal. No respiratory distress.     Breath sounds: Normal breath sounds. No wheezing, rhonchi or rales.  Musculoskeletal:     Right lower leg: No edema.     Left lower leg: No edema.  Skin:    General: Skin is warm and dry.     Findings: No rash.  Neurological:     Mental Status: He is alert.  Psychiatric:        Mood and Affect: Mood normal.        Behavior: Behavior normal.       Results for orders placed or performed in  visit on 07/25/22  IBC panel  Result Value Ref Range   Iron 134 42 - 165 ug/dL   Transferrin 230.0 212.0 - 360.0 mg/dL   Saturation Ratios 41.6 20.0 - 50.0 %   TIBC 322.0 250.0 - 450.0 mcg/dL  PSA, Medicare  Result Value Ref Range   PSA 4.64 (H) 0.10 - 4.00 ng/ml  Ferritin  Result Value Ref Range   Ferritin 92.0 22.0 - 322.0 ng/mL  CBC with Differential/Platelet  Result Value Ref Range   WBC 8.8 4.0 - 10.5 K/uL   RBC 5.59 4.22 - 5.81 Mil/uL   Hemoglobin 17.4 (H) 13.0 - 17.0 g/dL   HCT 52.4 (H) 39.0 - 52.0 %   MCV 93.9 78.0 - 100.0 fl   MCHC 33.2 30.0 - 36.0 g/dL   RDW 14.4 11.5 - 15.5 %   Platelets 307.0 150.0 - 400.0 K/uL   Neutrophils Relative % 78.0 (H) 43.0 - 77.0 %   Lymphocytes Relative 13.1 12.0 - 46.0 %   Monocytes Relative 7.4 3.0 - 12.0 %   Eosinophils Relative 0.9 0.0 - 5.0 %   Basophils Relative 0.6 0.0 - 3.0 %   Neutro Abs 6.8 1.4 - 7.7 K/uL   Lymphs Abs 1.1 0.7 - 4.0 K/uL   Monocytes Absolute 0.6 0.1 - 1.0 K/uL   Eosinophils Absolute 0.1 0.0 - 0.7 K/uL   Basophils Absolute 0.1 0.0 - 0.1 K/uL  Hepatitis C antibody  Result Value Ref Range   Hepatitis C Ab NON-REACTIVE NON-REACTIVE  Comprehensive metabolic panel  Result Value Ref Range   Sodium 141 135 - 145 mEq/L   Potassium 4.8 3.5 - 5.1 mEq/L   Chloride 103 96 - 112 mEq/L   CO2 30 19 - 32 mEq/L   Glucose, Bld 107 (H) 70 - 99 mg/dL   BUN 25 (H) 6 - 23 mg/dL   Creatinine, Ser 1.11 0.40 - 1.50 mg/dL   Total Bilirubin 0.6 0.2 - 1.2 mg/dL   Alkaline Phosphatase 64 39 - 117 U/L   AST 16 0 - 37 U/L   ALT 19 0 - 53 U/L   Total Protein 7.4 6.0 - 8.3 g/dL   Albumin 4.5 3.5 - 5.2 g/dL   GFR 68.58 >60.00 mL/min   Calcium 10.1 8.4 - 10.5 mg/dL  Lipid panel  Result Value Ref Range   Cholesterol 181 0 - 200 mg/dL   Triglycerides 134.0 0.0 - 149.0 mg/dL   HDL 56.30 >39.00 mg/dL   VLDL  26.8 0.0 - 40.0 mg/dL   LDL Cholesterol 98 0 - 99 mg/dL   Total CHOL/HDL Ratio 3    NonHDL 124.40     Assessment & Plan:    Problem List Items Addressed This Visit     HTN (hypertension)    BP elevated - anticipate component of white coat hypertension given home readings significantly better controlled. Will continue to monitor on current regimen, no changes today.       Eyelid retraction or lag - Primary    Reviewed recent eye doctor note - concern for thyroid disease given bilateral lid lag/retraction noted. He denies significant hyper or hypothyroid symptoms. Will further evaluate with thyroid testing today - TSH, fT4, T3, anti-TPO, anti-TSHr. Discussed possible endo referral if testing consistent with hyperthyroidism. Continue BB for now.       Relevant Orders   TSH   T4, free   T3   Thyroid Peroxidase Antibodies (TPO) (REFL)-[facility]   TRAb (TSH Receptor Binding Antibody)     No orders of the defined types were placed in this encounter.   Orders Placed This Encounter  Procedures   TSH   T4, free   T3   Thyroid Peroxidase Antibodies (TPO) (REFL)-[facility]   TRAb (TSH Receptor Binding Antibody)    Patient Instructions  Labs today to for evaluate thyroid disease/thyroid function - we will be in touch with result.  Continue current medicines of metoprolol and valsartan.   Follow up plan: Return if symptoms worsen or fail to improve.  Ria Bush, MD

## 2022-09-10 NOTE — Assessment & Plan Note (Signed)
BP elevated - anticipate component of white coat hypertension given home readings significantly better controlled. Will continue to monitor on current regimen, no changes today.

## 2022-09-12 LAB — T3: T3, Total: 144 ng/dL (ref 76–181)

## 2022-09-12 LAB — THYROID PEROXIDASE ANTIBODIES (TPO) (REFL): Thyroperoxidase Ab SerPl-aCnc: <1 IU/mL (ref <9)

## 2022-09-12 LAB — TRAB (TSH RECEPTOR BINDING ANTIBODY): TRAB: <1 IU/L (ref <=2.00)

## 2022-09-30 ENCOUNTER — Encounter: Payer: Self-pay | Admitting: Family Medicine

## 2022-09-30 ENCOUNTER — Ambulatory Visit (INDEPENDENT_AMBULATORY_CARE_PROVIDER_SITE_OTHER): Payer: Medicare Other | Admitting: Family Medicine

## 2022-09-30 VITALS — BP 156/88 | HR 81 | Temp 97.4°F | Ht 65.0 in | Wt 149.1 lb

## 2022-09-30 DIAGNOSIS — I10 Essential (primary) hypertension: Secondary | ICD-10-CM | POA: Diagnosis not present

## 2022-09-30 DIAGNOSIS — R972 Elevated prostate specific antigen [PSA]: Secondary | ICD-10-CM | POA: Diagnosis not present

## 2022-09-30 DIAGNOSIS — G8929 Other chronic pain: Secondary | ICD-10-CM

## 2022-09-30 DIAGNOSIS — G4700 Insomnia, unspecified: Secondary | ICD-10-CM | POA: Diagnosis not present

## 2022-09-30 DIAGNOSIS — H02539 Eyelid retraction unspecified eye, unspecified lid: Secondary | ICD-10-CM

## 2022-09-30 DIAGNOSIS — R1031 Right lower quadrant pain: Secondary | ICD-10-CM

## 2022-09-30 MED ORDER — LORAZEPAM 0.5 MG PO TABS: 0.5000 mg | ORAL_TABLET | Freq: Every evening | ORAL | 0 refills | Status: DC | PRN

## 2022-09-30 MED ORDER — AMLODIPINE BESYLATE 2.5 MG PO TABS
2.5000 mg | ORAL_TABLET | Freq: Every day | ORAL | 3 refills | Status: DC
Start: 2022-09-30 — End: 2023-10-06

## 2022-09-30 MED ORDER — VALSARTAN 320 MG PO TABS: 320.0000 mg | ORAL_TABLET | Freq: Every day | ORAL | 3 refills | Status: DC

## 2022-09-30 NOTE — Patient Instructions (Addendum)
For blood pressure - continue current regimen, add low dose amlodipine 2.5mg  daily.  For trouble sleeping - see below checklist. Try as needed lorazepam 0.5mg  for sleep.  Labs today.   Bedtime routine checklist: 1. Avoid naps during the day 2. Avoid stimulants such as caffeine and nicotine. Avoid bedtime alcohol (it can speed onset of sleep but the body's metabolism can cause awakenings). 3. All forms of exercise help ensure sound sleep - limit vigorous exercise to morning or late afternoon 4. Avoid food too close to bedtime including chocolate (which contains caffeine) 5. Soak up natural light 6. Establish regular bedtime routine. 7. Associate bed with sleep - avoid TV, computer or phone, reading while in bed. 8. Ensure pleasant, relaxing sleep environment - quiet, dark, cool room.   New BP regimen: Valsartan  daily in am Toprol XL  daily in am Amlodipine 2.5mg  daily in am

## 2022-09-30 NOTE — Progress Notes (Unsigned)
Ph: [phone]       Fax: [phone]   Patient ID: Oscar Bird, male    DOB: [birth year], 67 y.o.   MRN: [id number]  This visit was conducted in person.  BP (!) 156/88 (BP Location: Right Arm, Cuff Size: Normal)   Pulse 81   Temp (!) 97.4 F (36.3 C) (Temporal)   Ht 5\' 5"  (1.651 m)   Wt 149 lb 2 oz (67.6 kg)   SpO2 99%   BMI 24.82 kg/m   BP Readings from Last 3 Encounters:  09/30/22 (!) 156/88  09/10/22 (!) 150/98  08/01/22 (!) 144/82    CC: 2 mo f/u visit  Subjective:   HPI: Oscar Bird is a 67 y.o. male presenting on 09/30/2022 for Medical Management of Chronic Issues (Here for 2 mo f/u. Pt brought in home BP monitor to compare. Reading in office today- 167/93.)   HTN - Compliant with current antihypertensive regimen of toprol XL 25mg  daily, valsartan 320mg  daily. Does check blood pressures at home and brings BP cuff - comparable to our readings. Home log: 120-160s/70-90s, HR 60-70s. Anticipate component of white coat hypertension. Already limiting salt, has decreased caffeine, overall eating healthy. No low blood pressure readings or symptoms of dizziness/syncope. Denies HA, vision changes, CP/tightness, SOB, leg swelling.    Didn't sleep well last night after taking tylenol PM.  No alcohol in 40 yrs, h/o excessive alcohol use.   Recent concern by retinologist for eyelid retraction/lag concerning for thyroid related eye disease. TFTs returned normal as per below. He denies eyelid or muscle fatigue or double vision.   Continues [facility] with benefit - notes improvement to chronic R groin pain.  Upcoming long distance road trip - leaving next month.   Latest PSA 4.6 - denies urinary symptoms of nocturia, dysuria, urgency, frequency, hematuria, weakening stream, hesitancy or dribbling.   Recent contrasted CT abd/pelvis 08/2022 - prostate WNL. Last saw urology 07/2022 - reassuring cystoscopy.      Relevant past medical, surgical, family and  social history reviewed and updated as indicated. Interim medical history since our last visit reviewed. Allergies and medications reviewed and updated. Outpatient Medications Prior to Visit  Medication Sig Dispense Refill   atorvastatin (LIPITOR) 10 MG tablet Take 1 tablet (10 mg total) by mouth daily. 90 tablet 4   Cholecalciferol (VITAMIN D3 PO) Take by mouth daily.     Cyanocobalamin (VITAMIN B12 PO) Take by mouth daily.     Krill Oil 500 MG CAPS Take by mouth.     metoprolol succinate (TOPROL-XL) 25 MG 24 hr tablet Take 1 tablet (25 mg total) by mouth daily. 90 tablet 4   Misc Natural Products (OSTEO BI-FLEX JOINT SHIELD PO) Take by mouth.     MISC NATURAL PRODUCTS PO Take by mouth. Serra enzyme     Turmeric 500 MG CAPS Take by mouth.     valsartan (DIOVAN) 160 MG tablet Take 1 tablet (160 mg total) by mouth 2 (two) times daily. 90 tablet 4   No facility-administered medications prior to visit.     Per HPI unless specifically indicated in ROS section below Review of Systems  Objective:  BP (!) 156/88 (BP Location: Right Arm, Cuff Size: Normal)   Pulse 81   Temp (!) 97.4 F (36.3 C) (Temporal)   Ht 5\' 5"  (1.651 m)   Wt 149 lb 2 oz (67.6 kg)   SpO2 99%   BMI 24.82 kg/m   Wt  Readings from Last 3 Encounters:  09/30/22 149 lb 2 oz (67.6 kg)  09/10/22 148 lb 4 oz (67.2 kg)  08/01/22 148 lb 2 oz (67.2 kg)      Physical Exam Vitals and nursing note reviewed.  Constitutional:      Appearance: Normal appearance. He is not ill-appearing.  HENT:     Head: Normocephalic and atraumatic.     Mouth/Throat:     Mouth: Mucous membranes are moist.     Pharynx: Oropharynx is clear. No oropharyngeal exudate or posterior oropharyngeal erythema.  Eyes:     Extraocular Movements: Extraocular movements intact.     Pupils: Pupils are equal, round, and reactive to light.  Neck:     Thyroid: No thyroid mass or thyromegaly.  Cardiovascular:     Rate and Rhythm: Normal rate and regular  rhythm.     Pulses: Normal pulses.     Heart sounds: Normal heart sounds. No murmur heard. Pulmonary:     Effort: Pulmonary effort is normal. No respiratory distress.     Breath sounds: Normal breath sounds. No wheezing, rhonchi or rales.  Musculoskeletal:     Cervical back: Normal range of motion and neck supple.     Right lower leg: No edema.     Left lower leg: No edema.  Skin:    General: Skin is warm and dry.     Findings: No rash.  Neurological:     Mental Status: He is alert.  Psychiatric:        Mood and Affect: Mood normal.        Behavior: Behavior normal.       Results for orders placed or performed in visit on 09/10/22  TSH  Result Value Ref Range   TSH 1.45 0.35 - 5.50 uIU/mL  T4, free  Result Value Ref Range   Free T4 0.82 0.60 - 1.60 ng/dL  T3  Result Value Ref Range   T3, Total 144 76 - 181 ng/dL  Thyroid Peroxidase Antibodies (TPO) (REFL)-[facility]  Result Value Ref Range   Thyroperoxidase Ab SerPl-aCnc <1 <9 IU/mL  TRAb (TSH Receptor Binding Antibody)  Result Value Ref Range   TRAB <1.00 <=2.00 IU/L    Assessment & Plan:   Problem List Items Addressed This Visit   None Visit Diagnoses     Elevated PSA    -  Primary   Relevant Orders   PSA, Total with Reflex to PSA, Free        Meds ordered this encounter  Medications   valsartan (DIOVAN) 320 MG tablet    Sig: Take 1 tablet (320 mg total) by mouth daily.    Dispense:  90 tablet    Refill:  3    Note new sig/dose   amLODipine (NORVASC) 2.5 MG tablet    Sig: Take 1 tablet (2.5 mg total) by mouth daily.    Dispense:  90 tablet    Refill:  3   LORazepam (ATIVAN) 0.5 MG tablet    Sig: Take 1 tablet (0.5 mg total) by mouth at bedtime as needed for sleep (sedation precautions).    Dispense:  20 tablet    Refill:  0    Orders Placed This Encounter  Procedures   PSA, Total with Reflex to PSA, Free    Patient Instructions  For blood pressure - continue current regimen, add low dose  amlodipine 2.5mg  daily.  For trouble sleeping - see below checklist. Try as needed lorazepam 0.5mg  for sleep.  Labs today.   Bedtime routine checklist: 1. Avoid naps during the day 2. Avoid stimulants such as caffeine and nicotine. Avoid bedtime alcohol (it can speed onset of sleep but the body's metabolism can cause awakenings). 3. All forms of exercise help ensure sound sleep - limit vigorous exercise to morning or late afternoon 4. Avoid food too close to bedtime including chocolate (which contains caffeine) 5. Soak up natural light 6. Establish regular bedtime routine. 7. Associate bed with sleep - avoid TV, computer or phone, reading while in bed. 8. Ensure pleasant, relaxing sleep environment - quiet, dark, cool room.   New BP regimen: Valsartan  daily in am Toprol XL  daily in am Amlodipine 2.5mg  daily in am  Follow up plan: No follow-ups on file.  Oscar Boyden, MD

## 2022-10-01 ENCOUNTER — Encounter: Payer: Self-pay | Admitting: Family Medicine

## 2022-10-01 DIAGNOSIS — R972 Elevated prostate specific antigen [PSA]: Secondary | ICD-10-CM | POA: Insufficient documentation

## 2022-10-01 DIAGNOSIS — G4700 Insomnia, unspecified: Secondary | ICD-10-CM | POA: Insufficient documentation

## 2022-10-01 NOTE — Assessment & Plan Note (Signed)
Longstanding issue. Sleep hygiene checklist provided today.  Previously benefited from rare xanax PRN. Reviewed options ie trazodone vs short acting benzo PRN.  Will Rx lorazepam 0.5mg  1/2-1 tab QHS PRN sleep. Discussed pros and cons of controlled substances and expectations to receive prescription from our office. Patient is not to abuse, misuse, divert or use medication other than as prescribed. Discussed recommended short term use of med. Discussed risks of medication including dependence, tolerance, and addiction/abuse potential.

## 2022-10-01 NOTE — Assessment & Plan Note (Addendum)
BP remains elevated despite valsartan  daily, Toprol XL  daily. Will add amlodipine 2.5mg  daily. Previously higher dose caused markedly low BPs.  Home cuff brought in today comparable to in-office reading.

## 2022-10-01 NOTE — Assessment & Plan Note (Signed)
Continues benefiting from PFPT with [facility] in [place].

## 2022-10-01 NOTE — Assessment & Plan Note (Signed)
TFTs including anti-TPO and TSHr Ab negative.  Symptoms not consistent with myasthenia.

## 2022-10-01 NOTE — Assessment & Plan Note (Addendum)
Latest PSA 4.6. pt denies prostate or LUTS symptoms.  Update PSA today with reflex to free PSA if elevated.

## 2022-10-02 LAB — PSA, TOTAL WITH REFLEX TO PSA, FREE: PSA, Total: 7.3 ng/mL — ABNORMAL HIGH (ref <=4.0)

## 2022-10-02 LAB — REFLEX PSA, FREE
PSA, % Free: 37 % (calc) (ref >25)
PSA, Free: 2.7 ng/mL

## 2022-10-04 ENCOUNTER — Other Ambulatory Visit: Payer: Self-pay | Admitting: Family Medicine

## 2022-10-04 DIAGNOSIS — R972 Elevated prostate specific antigen [PSA]: Secondary | ICD-10-CM

## 2022-10-06 ENCOUNTER — Encounter: Payer: Self-pay | Admitting: Family Medicine

## 2022-10-09 ENCOUNTER — Other Ambulatory Visit: Payer: Medicare Other

## 2022-12-11 IMAGING — CT CT ABD-PELV W/ CM
1 of 3 series · 14 of 32 positions shown, 19 images · IV contrast (agent unspecified)
Comparison: 01/29/2018

CLINICAL DATA: Right-sided pain and abdominal discomfort

EXAM:
CT ABDOMEN AND PELVIS WITH CONTRAST
TECHNIQUE: Multidetector CT imaging of the abdomen and pelvis was performed
using the standard protocol following bolus administration of
intravenous contrast.

[Series 2: a/p w/ 5mm · axial · 0.78mm/px · z∈[-486,-81]mm · 14 of 93 slices shown, 19 images]
[im 6/93  soft-tissue]
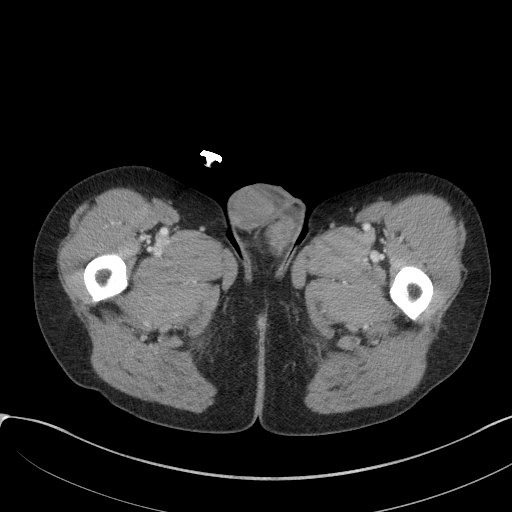
[im 6/93  bone]
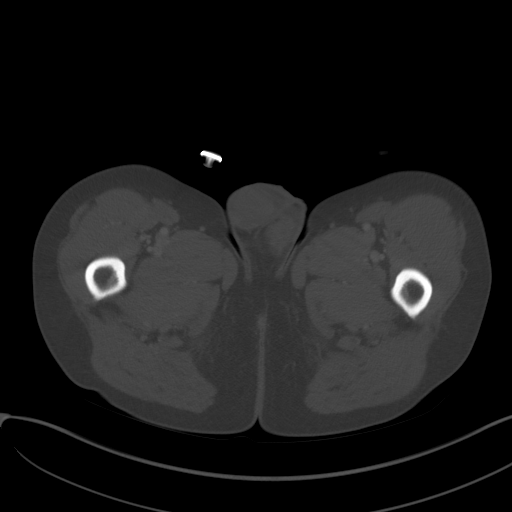
[im 11/93  soft-tissue]
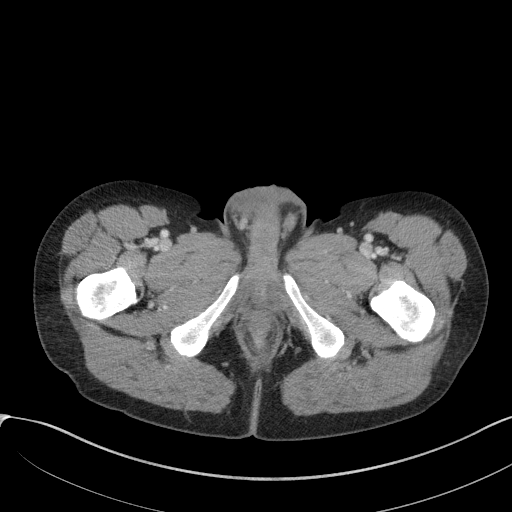
[im 22/93  soft-tissue]
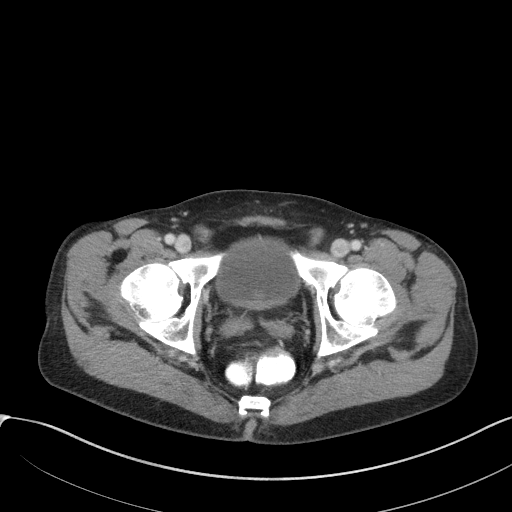
[im 28/93  soft-tissue]
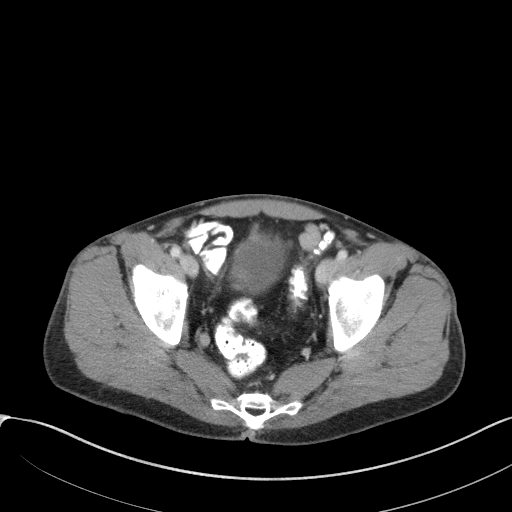
[im 33/93  soft-tissue]
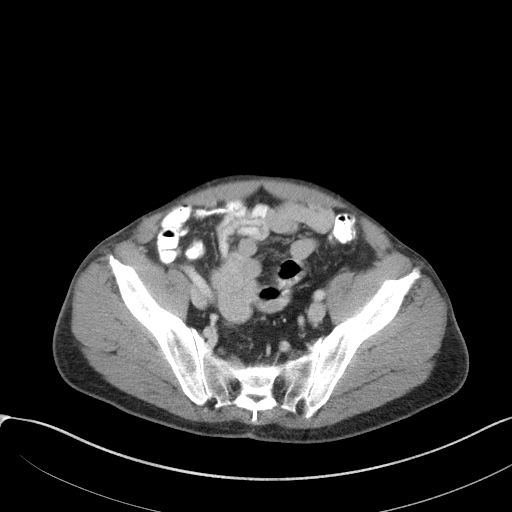
[im 38/93  soft-tissue]
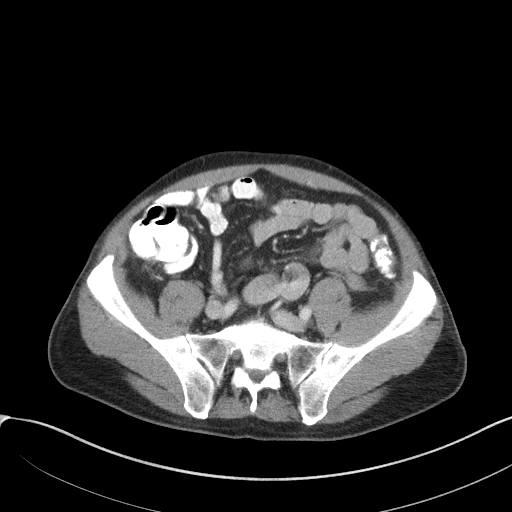
[im 49/93  soft-tissue]
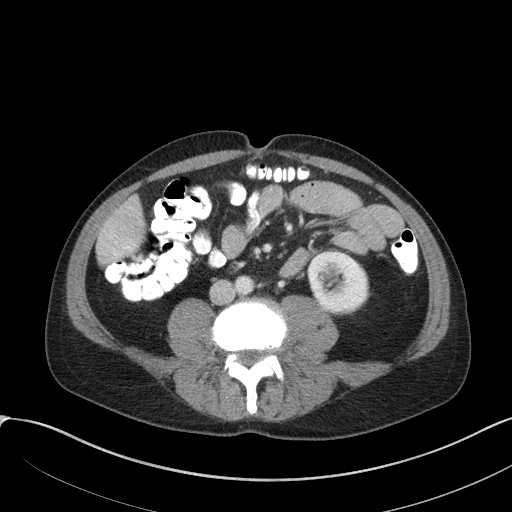
[im 55/93  soft-tissue]
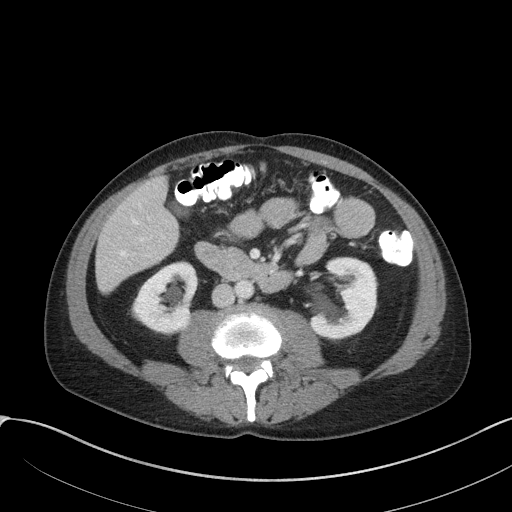
[im 60/93  soft-tissue]
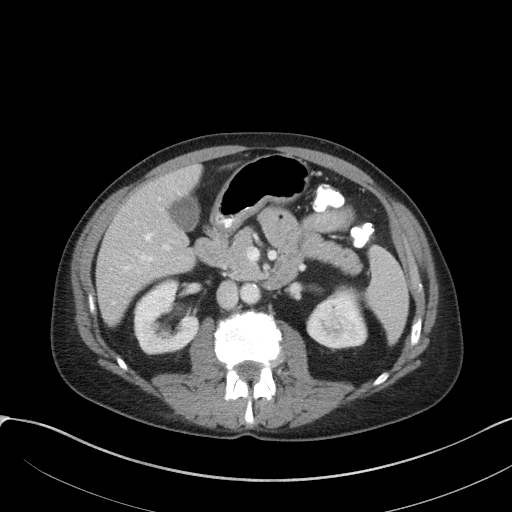
[im 60/93  bone]
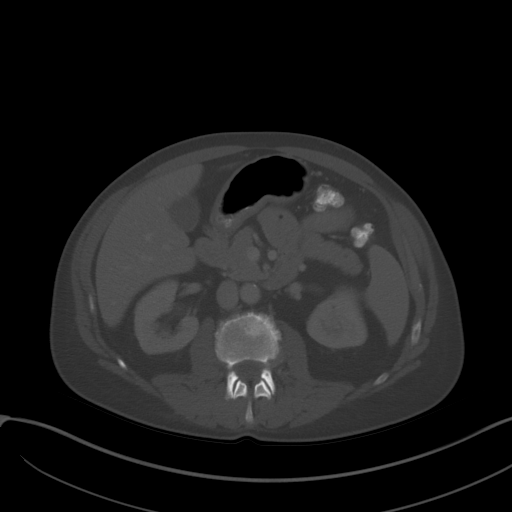
[im 65/93  soft-tissue]
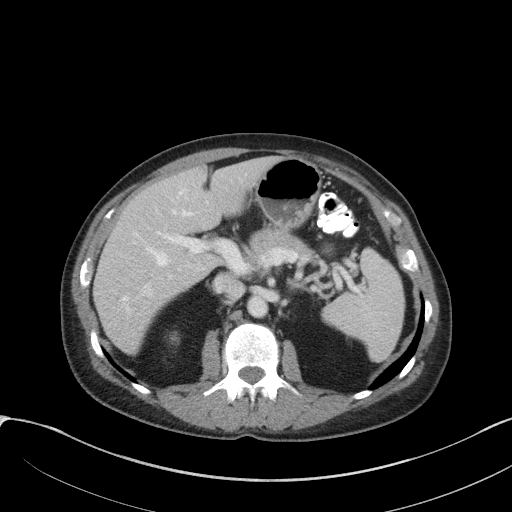
[im 71/93  soft-tissue]
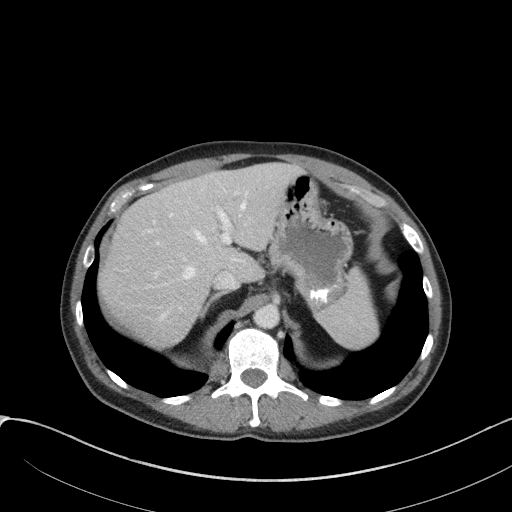
[im 71/93  lung]
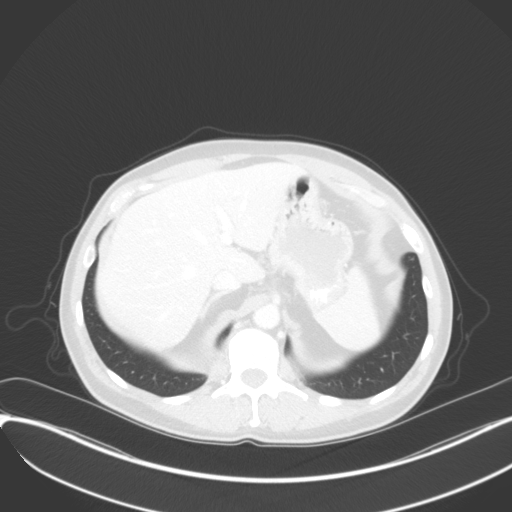
[im 76/93  lung]
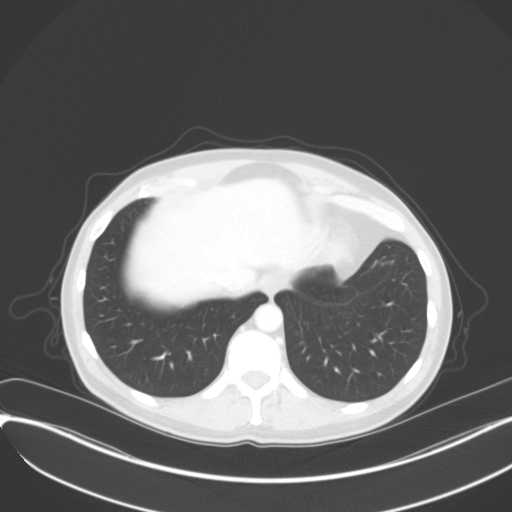
[im 82/93  soft-tissue]
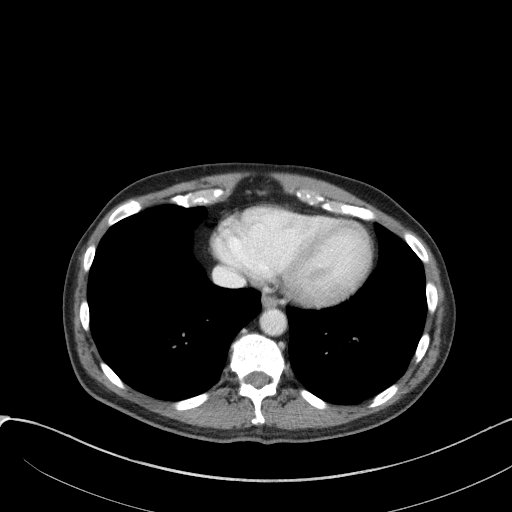
[im 82/93  lung]
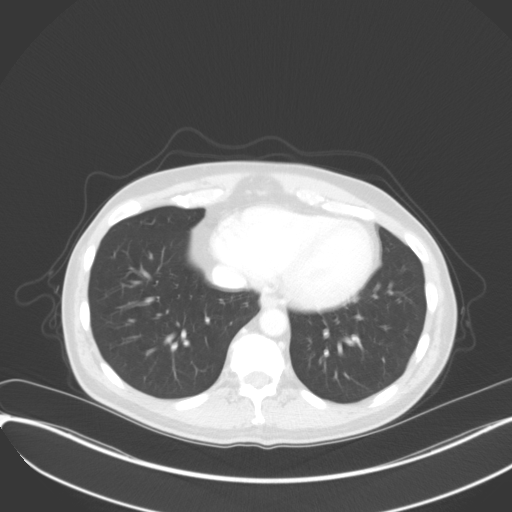
[im 87/93  soft-tissue]
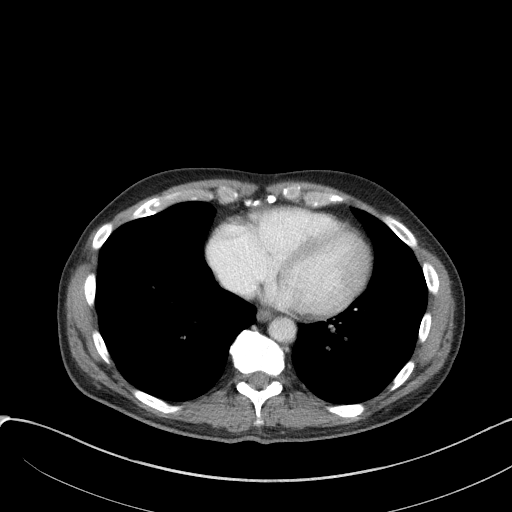
[im 87/93  lung]
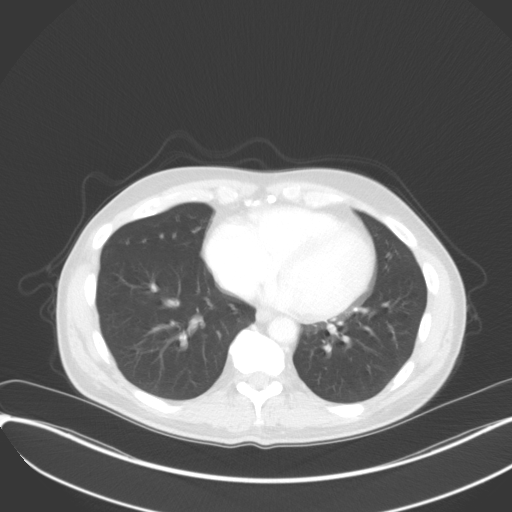

[14 of 32 positions shown; findings below may reference images not displayed]

RADIATION DOSE REDUCTION: This exam was performed according to the
departmental dose-optimization program which includes automated
exposure control, adjustment of the mA and/or kV according to
patient size and/or use of iterative reconstruction technique.

CONTRAST:  100mL FTDAJ4-2SS IOPAMIDOL (FTDAJ4-2SS) INJECTION 61%
FINDINGS: Lower chest: No acute pleural or parenchymal lung disease.

Hepatobiliary: No focal liver abnormality is seen. No gallstones,
gallbladder wall thickening, or biliary dilatation.

Pancreas: Unremarkable. No pancreatic ductal dilatation or
surrounding inflammatory changes.

Spleen: Normal in size without focal abnormality.

Adrenals/Urinary Tract: 2 mm nonobstructing left renal calculus. No
right-sided calculi. No obstructive uropathy within either kidney.
Bilateral peripelvic renal cysts. The adrenals and bladder are
unremarkable.

Stomach/Bowel: No bowel obstruction or ileus. Normal appendix right
lower quadrant. No bowel wall thickening or inflammatory change.

Vascular/Lymphatic: No significant vascular findings are present. No
enlarged abdominal or pelvic lymph nodes.

Reproductive: Prostate is unremarkable.

Other: No free fluid or free intraperitoneal gas. No abdominal wall
hernia.

Musculoskeletal: No acute or destructive bony lesions. Reconstructed
images demonstrate no additional findings.
IMPRESSION: 1. Nonobstructing 2 mm left renal calculus.
2. Otherwise no acute intra-abdominal or intrapelvic process.

## 2022-12-24 ENCOUNTER — Other Ambulatory Visit: Payer: Self-pay | Admitting: Nurse Practitioner

## 2022-12-24 DIAGNOSIS — R972 Elevated prostate specific antigen [PSA]: Secondary | ICD-10-CM

## 2023-03-18 ENCOUNTER — Encounter: Payer: Self-pay | Admitting: Family Medicine

## 2023-03-18 ENCOUNTER — Ambulatory Visit (INDEPENDENT_AMBULATORY_CARE_PROVIDER_SITE_OTHER): Payer: Medicare Other | Admitting: Family Medicine

## 2023-03-18 VITALS — BP 162/85 | HR 95 | Temp 97.9°F | Ht 65.0 in | Wt 147.5 lb

## 2023-03-18 DIAGNOSIS — I10 Essential (primary) hypertension: Secondary | ICD-10-CM | POA: Diagnosis not present

## 2023-03-18 DIAGNOSIS — Z23 Encounter for immunization: Secondary | ICD-10-CM

## 2023-03-18 DIAGNOSIS — R1031 Right lower quadrant pain: Secondary | ICD-10-CM

## 2023-03-18 DIAGNOSIS — R972 Elevated prostate specific antigen [PSA]: Secondary | ICD-10-CM | POA: Diagnosis not present

## 2023-03-18 DIAGNOSIS — G8929 Other chronic pain: Secondary | ICD-10-CM

## 2023-03-18 MED ORDER — AMITRIPTYLINE HCL 50 MG PO TABS: 50.0000 mg | ORAL_TABLET | Freq: Every day | ORAL | 3 refills | Status: DC

## 2023-03-18 NOTE — Patient Instructions (Addendum)
Flu shot today  Check to see if you've had pelvic MRI done ?sports hernia.  Trial amitriptyline 25-50mg  nightly for pain modulation effect.  Return in 1 month for follow up visit

## 2023-03-18 NOTE — Progress Notes (Unsigned)
Ph: [phone] Fax: [phone]   Patient ID: Oscar Bird, male    DOB: April 12, 1956, 67 y.o.   MRN: [id number]  This visit was conducted in person.  BP 135/85 Comment: recent home readings  Pulse 95   Temp 97.9 F (36.6 C) (Oral)   Ht 5\' 5"  (1.651 m)   Wt 147 lb 8 oz (66.9 kg)   SpO2 99%   BMI 24.55 kg/m   170/90 on repeat BP Readings from Last 3 Encounters:  03/19/23 135/85  09/30/22 (!) 156/88  09/10/22 (!) 150/98    CC: ongoing side pain  Subjective:   HPI: Oscar Bird is a 67 y.o. male presenting on 03/18/2023 for on goning side pain   Recent return for cross country trip throughout the states for 4 months. Returned a few weeks ago.  HTN - BP elevated despite amlodipine 2.5mg  daily, valsartan 320mg  daily, and Toprol XL 25mg  daily. Home BP readings 130/80s.   Notes ongoing R groin pain to inguinal area, at times wakes up with sharp stabbing pain to the area. Pain unchanged. No burning pain, no paresthesias/tingling.  He's used TENS machine with benefit.  Tylenol has not helped.  Tramadol didn't help.  Has increased advil use due to pain during road trip.  Found CBD/THC was beneficial during trip.  Hasn't tried gabapentin or amitriptyline.  Continues [facility] with benefit for chronic R groin pain despite inguinal hernia repair May 2023 ([place]).   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  Contrasted CT abd/pelvis 08/2022 - parapelvic renal cysts, prostate WNL. Last saw urology 07/2022 - reassuring cystoscopy. Tramadol didn't help.   Planned prostate MRI next week through urology for elevated PSA.      Relevant past medical, surgical, family and social history reviewed and updated as indicated. Interim medical history since our last visit reviewed. Allergies and medications reviewed  and updated. Outpatient Medications Prior to Visit  Medication Sig Dispense Refill   amLODipine (NORVASC) 2.5 MG tablet Take 1 tablet (2.5 mg total) by mouth daily. 90 tablet 3   atorvastatin (LIPITOR) 10 MG tablet Take 1 tablet (10 mg total) by mouth daily. 90 tablet 4   Cholecalciferol (VITAMIN D3 PO) Take by mouth daily.     Cyanocobalamin (VITAMIN B12 PO) Take by mouth daily.     Krill Oil 500 MG CAPS Take by mouth.     LORazepam (ATIVAN) 0.5 MG tablet Take 1 tablet (0.5 mg total) by mouth at bedtime as needed for sleep (sedation precautions). 20 tablet 0   metoprolol succinate (TOPROL-XL) 25 MG 24 hr tablet Take 1 tablet (25 mg total) by mouth daily. 90 tablet 4   Misc Natural Products (OSTEO BI-FLEX JOINT SHIELD PO) Take by mouth.     MISC NATURAL PRODUCTS PO Take by mouth. Serra enzyme     tamsulosin (FLOMAX) 0.4 MG CAPS capsule Take 0.4 mg by mouth daily.     Turmeric 500 MG CAPS Take by mouth.     valsartan (DIOVAN) 320 MG tablet Take 1 tablet (320 mg total) by mouth daily. 90 tablet 3   No facility-administered medications prior to visit.     Per HPI unless specifically indicated in ROS section below Review of Systems  Objective:  BP 135/85 Comment: recent home readings  Pulse 95  Temp 97.9 F (36.6 C) (Oral)   Ht 5\' 5"  (1.651 m)   Wt 147 lb 8 oz (66.9 kg)   SpO2 99%   BMI 24.55 kg/m   Wt Readings from Last 3 Encounters:  03/18/23 147 lb 8 oz (66.9 kg)  09/30/22 149 lb 2 oz (67.6 kg)  09/10/22 148 lb 4 oz (67.2 kg)      Physical Exam Vitals and nursing note reviewed.  Constitutional:      Appearance: Normal appearance. He is not ill-appearing.  HENT:     Head: Normocephalic and atraumatic.     Mouth/Throat:     Mouth: Mucous membranes are moist.     Pharynx: Oropharynx is clear. No oropharyngeal exudate or posterior oropharyngeal erythema.  Eyes:     Extraocular Movements: Extraocular movements intact.     Conjunctiva/sclera: Conjunctivae normal.      Pupils: Pupils are equal, round, and reactive to light.  Cardiovascular:     Rate and Rhythm: Normal rate and regular rhythm.     Pulses: Normal pulses.     Heart sounds: Normal heart sounds. No murmur heard. Pulmonary:     Effort: Pulmonary effort is normal. No respiratory distress.     Breath sounds: Normal breath sounds. No wheezing, rhonchi or rales.  Abdominal:     General: Bowel sounds are normal. There is no distension.     Palpations: Abdomen is soft. There is no mass.     Tenderness: There is abdominal tenderness (moderate) in the right lower quadrant. There is no right CVA tenderness, left CVA tenderness, guarding or rebound. Negative signs include Murphy's sign and psoas sign.     Hernia: No hernia is present. There is no hernia in the umbilical area, left inguinal area or right inguinal area.     Comments: Poin tender to an area at RLQ without rebound or guarding, ?obliques vs psoas mm on right  Musculoskeletal:     Right lower leg: No edema.     Left lower leg: No edema.     Comments:  No pain midline spine No paraspinous mm tenderness Neg SLR bilaterally. No pain with int/ext rotation at hip. Neg FABER. Reproducible pain to L groin with hip flexion against resistance   Skin:    General: Skin is warm and dry.     Findings: No rash.  Neurological:     Mental Status: He is alert.  Psychiatric:        Mood and Affect: Mood normal.        Behavior: Behavior normal.       Results for orders placed or performed in visit on 09/30/22  PSA, Total with Reflex to PSA, Free  Result Value Ref Range   PSA, Total 7.3 (H) < OR = 4.0 ng/mL  reflex PSA, Free  Result Value Ref Range   PSA, Free 2.7 ng/mL   PSA, % Free 37 >25 % (calc)   Lab Results  Component Value Date   NA 141 07/25/2022   CL 103 07/25/2022   K 4.8 07/25/2022   CO2 30 07/25/2022   BUN 25 (H) 07/25/2022   CREATININE 1.11 07/25/2022   GFR 68.58 07/25/2022   CALCIUM 10.1 07/25/2022   ALBUMIN 4.5  07/25/2022   GLUCOSE 107 (H) 07/25/2022    Lab Results  Component Value Date   ALT 19 07/25/2022   AST 16 07/25/2022   ALKPHOS 64 07/25/2022   BILITOT 0.6 07/25/2022   Lab Results  Component Value Date  WBC 8.8 07/25/2022   HGB 17.4 (H) 07/25/2022   HCT 52.4 (H) 07/25/2022   MCV 93.9 07/25/2022   PLT 307.0 07/25/2022    CT ABDOMEN PELVIS W CONTRAST CLINICAL DATA:  Right groin pain status post hernia repair  EXAM: CT ABDOMEN AND PELVIS WITH CONTRAST  TECHNIQUE: Multidetector CT imaging of the abdomen and pelvis was performed using the standard protocol following bolus administration of intravenous contrast.  RADIATION DOSE REDUCTION: This exam was performed according to the departmental dose-optimization program which includes automated exposure control, adjustment of the mA and/or kV according to patient size and/or use of iterative reconstruction technique.  CONTRAST:  Oral contrast and 100 mL ISOVUE-300 IOPAMIDOL (ISOVUE-300) INJECTION 61%  COMPARISON:  02/24/2022  FINDINGS: Lower chest: No acute abnormality.  Hepatobiliary: No focal liver abnormality is seen. No gallstones, gallbladder wall thickening, or biliary dilatation.  Pancreas: Unremarkable. No pancreatic ductal dilatation or surrounding inflammatory changes.  Spleen: Normal in size without focal abnormality.  Adrenals/Urinary Tract: Bilateral parapelvic renal cysts, a stable finding. 3 mm left midpole stone. No hydronephrosis identified. No stones ureters. Urinary bladder wall thickening consistent with hypertrophy or inflammation. No adrenal lesions identified.  Stomach/Bowel: Stomach is within normal limits. Appendix appears normal. No evidence of bowel wall thickening, distention, or inflammatory changes. Oral contrast progressed to the rectosigmoid.  Vascular/Lymphatic: No significant vascular findings are present. No enlarged abdominal or pelvic lymph nodes.  Reproductive: Prostate is  unremarkable.  Other: No abdominal wall hernia or abnormality. No abdominopelvic ascites.  Musculoskeletal: Thoracolumbosacral degenerative changes noted.  IMPRESSION: 1. Parapelvic renal cysts. 2. Left-sided renal stones. 3. Urinary bladder wall thickening which can be seen with hypertrophy or inflammation. 4. Otherwise no acute abdominal or pelvic pathology identified  Electronically Signed   By: Layla Maw M.D.   On: 08/24/2022 12:46  DG HIP (WITH OR WITHOUT PELVIS) 2-3V RIGHT COMPARISON:  None Available. FINDINGS: There is no evidence of hip fracture or dislocation. Mild degenerative changes are seen in the form of joint space narrowing and acetabular sclerosis  IMPRESSION: Mild degenerative changes in the right hip. Electronically Signed   By: Aram Candela M.D.   On: 06/17/2022 18:33  Assessment & Plan:   Problem List Items Addressed This Visit     White coat syndrome with diagnosis of hypertension    Chronic. Home readings largely well controlled (130/80s).  In office significantly elevated.  Will treat based on home readings.  Continue amlodipine 2.5mg  daily, valsartan 320mg  daily, Toprol XL 25mg  daily.       Groin pain, chronic, right - Primary    Chronic, longstanding for years, persistent despite inguinal hernia repair 10/2021. Notes ongoing significant activity limiting pain.  He has had both pelvic floor PT through urology office as well as outpatient PT through [facility] (continues this).  Has had repeated imaging, latest contrasted CT 08/2022 showing parapelvic renal cysts, normal prostate, no fluid collection.  Hip xrays without significant DJD, on exam not consistent with hip osteoarthritis.  Will check at home to see if he's had pelvic MRI - if not, consider this imaging given chronicity of symptoms.  Ok to continue CBD as needed. Will start amitriptyline 25-50mg  nightly for pain modulation effect. Reviewed side effects to  monitor.  RTC 1 mo f/u visit.       Relevant Medications   amitriptyline (ELAVIL) 50 MG tablet   Elevated PSA    Pending prostate MRI through urology for elevated PSA.  Other Visit Diagnoses     Need for influenza vaccination       Relevant Orders   Flu Vaccine Trivalent High Dose (Fluad) (Completed)        Meds ordered this encounter  Medications   amitriptyline (ELAVIL) 50 MG tablet    Sig: Take 1 tablet (50 mg total) by mouth at bedtime.    Dispense:  30 tablet    Refill:  3    Orders Placed This Encounter  Procedures   Flu Vaccine Trivalent High Dose (Fluad)    Patient Instructions  Flu shot today  Check to see if you've had pelvic MRI done ?sports hernia.  Trial amitriptyline 25-50mg  nightly for pain modulation effect.  Return in 1 month for follow up visit   Follow up plan: Return in about 1 month (around 04/18/2023), or if symptoms worsen or fail to improve, for follow up visit.  Oscar Boyden, MD

## 2023-03-19 ENCOUNTER — Encounter: Payer: Self-pay | Admitting: Family Medicine

## 2023-03-19 NOTE — Assessment & Plan Note (Signed)
Pending prostate MRI through urology for elevated PSA.

## 2023-03-19 NOTE — Assessment & Plan Note (Addendum)
Chronic, longstanding for years, persistent despite inguinal hernia repair 10/2021. Notes ongoing significant activity limiting pain.  He has had both pelvic floor PT through urology office as well as outpatient PT through [facility] (continues this).  Has had repeated imaging, latest contrasted CT 08/2022 showing parapelvic renal cysts, normal prostate, no fluid collection.  Hip xrays without significant DJD, on exam not consistent with hip osteoarthritis.  Will check at home to see if he's had pelvic MRI - if not, consider this imaging given chronicity of symptoms.  Ok to continue CBD as needed. Will start amitriptyline 25-50mg  nightly for pain modulation effect. Reviewed side effects to monitor.  RTC 1 mo f/u visit.

## 2023-03-19 NOTE — Assessment & Plan Note (Signed)
Chronic. Home readings largely well controlled (130/80s).  In office significantly elevated.  Will treat based on home readings.  Continue amlodipine 2.5mg  daily, valsartan 320mg  daily, Toprol XL 25mg  daily.

## 2023-03-23 ENCOUNTER — Encounter: Payer: Self-pay | Admitting: Family Medicine

## 2023-03-26 ENCOUNTER — Other Ambulatory Visit: Payer: Medicare Other

## 2023-03-26 ENCOUNTER — Ambulatory Visit
Admission: RE | Admit: 2023-03-26 | Discharge: 2023-03-26 | Disposition: A | Discharge status: Home / Self Care | Payer: Medicare Other | Source: Ambulatory Visit | Attending: Nurse Practitioner | Admitting: Nurse Practitioner

## 2023-03-26 DIAGNOSIS — R972 Elevated prostate specific antigen [PSA]: Secondary | ICD-10-CM

## 2023-03-26 MED ORDER — GADOPICLENOL 0.5 MMOL/ML IV SOLN
7.5000 mL | Freq: Once | INTRAVENOUS | Status: AC | PRN
  Administered 2023-03-26: 7 mL via INTRAVENOUS

## 2023-04-07 LAB — PSA: PSA: 2.31

## 2023-04-18 ENCOUNTER — Encounter: Payer: Self-pay | Admitting: Family Medicine

## 2023-05-04 ENCOUNTER — Ambulatory Visit (INDEPENDENT_AMBULATORY_CARE_PROVIDER_SITE_OTHER): Payer: Medicare Other | Admitting: Family Medicine

## 2023-05-04 ENCOUNTER — Encounter: Payer: Self-pay | Admitting: Family Medicine

## 2023-05-04 VITALS — BP 134/78 | HR 99 | Temp 97.7°F | Ht 65.0 in | Wt 149.0 lb

## 2023-05-04 DIAGNOSIS — R1031 Right lower quadrant pain: Secondary | ICD-10-CM

## 2023-05-04 DIAGNOSIS — R972 Elevated prostate specific antigen [PSA]: Secondary | ICD-10-CM | POA: Diagnosis not present

## 2023-05-04 DIAGNOSIS — G8929 Other chronic pain: Secondary | ICD-10-CM | POA: Diagnosis not present

## 2023-05-04 DIAGNOSIS — I10 Essential (primary) hypertension: Secondary | ICD-10-CM

## 2023-05-04 NOTE — Addendum Note (Signed)
Addended by: Eustaquio Boyden on: 05/04/2023 08:31 AM   Modules accepted: Level of Service

## 2023-05-04 NOTE — Assessment & Plan Note (Signed)
Appreciate urology care.  Recent prostate MRI through urology - reviewed. Planning repeat PSA in 6 months, deferring biopsy at this time.

## 2023-05-04 NOTE — Assessment & Plan Note (Addendum)
Chronic, longstanding. Notes significant improvement with amitriptyline.  50mg  dose caused grogginess next am - tolerating 25mg  dose better. May be interested in 10mg  dose at next refill - will let us know  when running low.  Discussed could also try tapering flomax use.

## 2023-05-04 NOTE — Progress Notes (Signed)
Ph: [phone] Fax: [phone]   Patient ID: Oscar Bird, male    DOB: December 03, 1955, 67 y.o.   MRN: [id number]  This visit was conducted in person.  BP 134/78   Pulse 99   Temp 97.7 F (36.5 C) (Oral)   Ht 5\' 5"  (1.651 m)   Wt 149 lb (67.6 kg)   SpO2 99%   BMI 24.79 kg/m    CC: f/u MRI prostate Subjective:   HPI: Oscar Bird is a 67 y.o. male presenting on 05/04/2023 for Results (Here for prostate MRI f/u.)   See prior note for details. Seen last month October 9 at that time we started amitriptyline 50 mg for chronic right groin pain which he has found helpful.  Initially was somewhat sedating so I suggested he take half a tablet at nighttime. He feels "so much better" on this medicine. Has restarted running 10-12 miles/wk, hiking as well, feels well with this. Thinks this is coming form adnesions.   Brings BP log - 100-120s/70-80s, HR 70-80s. He continues amlodipine 2.5mg  daily, toprol XL 25mg  daily, valsartan 320mg  daily.   [place] had prostate MRI on 03/26/2023 by urology. It was read as small PI-RADS category 4 lesion of the right posterior medial peripheral zone at the base of the prostate. Saw urology - planned rpt PSA in 6 months.  Lab Results  Component Value Date   PSA 2.31 04/07/2023   PSA 4.64 (H) 07/25/2022  PSA 7.3 (03/16/2022).      Relevant past medical, surgical, family and social history reviewed and updated as indicated. Interim medical history since our last visit reviewed. Allergies and medications reviewed and updated. Outpatient Medications Prior to Visit  Medication Sig Dispense Refill   amitriptyline (ELAVIL) 50 MG tablet Take 1 tablet (50 mg total) by mouth at bedtime. 30 tablet 3   amLODipine (NORVASC) 2.5 MG tablet Take 1 tablet (2.5 mg total) by mouth daily. 90 tablet 3   atorvastatin (LIPITOR) 10 MG tablet Take 1 tablet (10 mg total) by mouth daily. 90 tablet 4   Cholecalciferol (VITAMIN D3 PO) Take by mouth daily.      Cyanocobalamin (VITAMIN B12 PO) Take by mouth daily.     Krill Oil 500 MG CAPS Take by mouth.     LORazepam (ATIVAN) 0.5 MG tablet Take 1 tablet (0.5 mg total) by mouth at bedtime as needed for sleep (sedation precautions). 20 tablet 0   metoprolol succinate (TOPROL-XL) 25 MG 24 hr tablet Take 1 tablet (25 mg total) by mouth daily. 90 tablet 4   Misc Natural Products (OSTEO BI-FLEX JOINT SHIELD PO) Take by mouth.     MISC NATURAL PRODUCTS PO Take by mouth. Serra enzyme     tamsulosin (FLOMAX) 0.4 MG CAPS capsule Take 0.4 mg by mouth daily.     Turmeric 500 MG CAPS Take by mouth.     valsartan (DIOVAN) 320 MG tablet Take 1 tablet (320 mg total) by mouth daily. 90 tablet 3   No facility-administered medications prior to visit.     Per HPI unless specifically indicated in ROS section below Review of Systems  Objective:  BP 134/78   Pulse 99   Temp 97.7 F (36.5 C) (Oral)   Ht 5\' 5"  (1.651 m)   Wt 149 lb (67.6 kg)   SpO2 99%   BMI 24.79 kg/m   Wt Readings from Last 3 Encounters:  05/04/23 149 lb (67.6 kg)  03/18/23 147 lb 8 oz (66.9 kg)  09/30/22 149 lb 2 oz (67.6 kg)      Physical Exam Vitals and nursing note reviewed.  Constitutional:      Appearance: Normal appearance. He is not ill-appearing.  HENT:     Head: Normocephalic and atraumatic.     Mouth/Throat:     Mouth: Mucous membranes are moist.     Pharynx: Oropharynx is clear. No oropharyngeal exudate or posterior oropharyngeal erythema.  Eyes:     Extraocular Movements: Extraocular movements intact.     Pupils: Pupils are equal, round, and reactive to light.  Cardiovascular:     Rate and Rhythm: Normal rate and regular rhythm.     Pulses: Normal pulses.     Heart sounds: Normal heart sounds. No murmur heard. Pulmonary:     Effort: Pulmonary effort is normal. No respiratory distress.     Breath sounds: Normal breath sounds. No wheezing, rhonchi or rales.  Musculoskeletal:     Right lower leg: No edema.     Left  lower leg: No edema.  Skin:    General: Skin is warm and dry.     Findings: No rash.  Neurological:     Mental Status: He is alert.  Psychiatric:        Mood and Affect: Mood normal.        Behavior: Behavior normal.       Results for orders placed or performed in visit on 04/18/23  PSA  Result Value Ref Range   PSA 2.31     Assessment & Plan:   Problem List Items Addressed This Visit     White coat syndrome with diagnosis of hypertension    Chronic, BP well controlled in office and based on home readings.  Continue current regimen. Discussed possible tapering amlodipine dose.  Continue Toprol XL and valsartan.       Groin pain, chronic, right - Primary    Chronic, longstanding. Notes significant improvement with amitriptyline.  50mg  dose caused grogginess next am - tolerating 25mg  dose better. May be interested in 10mg  dose at next refill - will let us know  when running low.  Discussed could also try tapering flomax use.       Elevated PSA    Appreciate urology care.  Recent prostate MRI through urology - reviewed. Planning repeat PSA in 6 months, deferring biopsy at this time.         No orders of the defined types were placed in this encounter.   No orders of the defined types were placed in this encounter.   Patient Instructions  You are doing well  Could try tapering off amlodipine (BP med) and tamsulosin (for urine).  Let us know when next refill of amitriptyline is due - we can try 10mg  dose next Good to see you today.  Return in 3 months for physical/wellness visit   Follow up plan: Return in about 3 months (around 08/04/2023), or if symptoms worsen or fail to improve, for annual exam, prior fasting for blood work, medicare wellness visit.  Oscar Boyden, MD

## 2023-05-04 NOTE — Assessment & Plan Note (Signed)
Chronic, BP well controlled in office and based on home readings.  Continue current regimen. Discussed possible tapering amlodipine dose.  Continue Toprol XL and valsartan.

## 2023-05-04 NOTE — Patient Instructions (Addendum)
You are doing well  Could try tapering off amlodipine (BP med) and tamsulosin (for urine).  Let us know when next refill of amitriptyline is due - we can try 10mg  dose next Good to see you today.  Return in 3 months for physical/wellness visit

## 2023-07-14 ENCOUNTER — Other Ambulatory Visit: Payer: Self-pay | Admitting: Family Medicine

## 2023-08-06 ENCOUNTER — Ambulatory Visit: Payer: Medicare Other

## 2023-08-06 VITALS — BP 140/82 | Ht 65.0 in | Wt 156.0 lb

## 2023-08-06 DIAGNOSIS — Z0000 Encounter for general adult medical examination without abnormal findings: Secondary | ICD-10-CM

## 2023-08-06 NOTE — Patient Instructions (Signed)
 Mr. Kimes , Thank you for taking time to come for your Medicare Wellness Visit. I appreciate your ongoing commitment to your health goals. Please review the following plan we discussed and let me know if I can assist you in the future.   Referrals/Orders/Follow-Ups/Clinician Recommendations: none  This is a list of the screening recommended for you and due dates:  Health Maintenance  Topic Date Due   DTaP/Tdap/Td vaccine (1 - Tdap) Never done   Zoster (Shingles) Vaccine (1 of 2) Never done   Pneumonia Vaccine (1 of 1 - PCV) Never done   COVID-19 Vaccine (1 - 2024-25 season) 04/02/2025*   Medicare Annual Wellness Visit  08/05/2024   Colon Cancer Screening  05/13/2026   Flu Shot  Completed   Hepatitis C Screening  Completed   HPV Vaccine  Aged Out  *Topic was postponed. The date shown is not the original due date.    Advanced directives: (Copy Requested) Please bring a copy of your health care power of attorney and living will to the office to be added to your chart at your convenience.  Next Medicare Annual Wellness Visit scheduled for next year: Yes 08/09/23 @ 3pm in person

## 2023-08-06 NOTE — Progress Notes (Signed)
 Subjective:   Oscar Bird is a 68 y.o. who presents for a Medicare Wellness preventive visit.  Visit Complete: In person  AWV Questionnaire: Yes: Patient Medicare AWV questionnaire was completed by the patient on 08/05/23; I have confirmed that all information answered by patient is correct and no changes since this date. Cardiac Risk Factors include: advanced age (>82men, >20 women);dyslipidemia;hypertension;male gender    Objective:    Today's Vitals   08/06/23 1503  Weight: 156 lb (70.8 kg)  Height: 5\' 5"  (1.651 m)   Body mass index is 25.96 kg/m.     08/06/2023    3:22 PM 10/10/2021    7:57 AM 10/03/2021   10:19 AM  Advanced Directives  Does Patient Have a Medical Advance Directive? Yes Yes Yes  Type of Estate agent of [place];Living will Healthcare Power of [place];Living will Healthcare Power of [place];Living will  Does patient want to make changes to medical advance directive?  No - Patient declined No - Patient declined  Copy of Healthcare Power of Attorney in Chart? No - copy requested No - copy requested No - copy requested    Current Medications (verified) Outpatient Encounter Medications as of 08/06/2023  Medication Sig   amitriptyline (ELAVIL) 50 MG tablet TAKE 1 TABLET BY MOUTH AT BEDTIME   amLODipine (NORVASC) 2.5 MG tablet Take 1 tablet (2.5 mg total) by mouth daily.   atorvastatin (LIPITOR) 10 MG tablet Take 1 tablet (10 mg total) by mouth daily.   Cholecalciferol (VITAMIN D3 PO) Take by mouth daily.   Cyanocobalamin (VITAMIN B12 PO) Take by mouth daily.   Krill Oil 500 MG CAPS Take by mouth.   LORazepam (ATIVAN) 0.5 MG tablet Take 1 tablet (0.5 mg total) by mouth at bedtime as needed for sleep (sedation precautions).   metoprolol succinate (TOPROL-XL) 25 MG 24 hr tablet Take 1 tablet (25 mg total) by mouth daily.   Misc Natural Products (OSTEO BI-FLEX JOINT SHIELD PO) Take by mouth.   MISC NATURAL PRODUCTS PO Take by mouth.  Serra enzyme   tamsulosin (FLOMAX) 0.4 MG CAPS capsule Take 0.4 mg by mouth daily.   Turmeric 500 MG CAPS Take by mouth.   valsartan (DIOVAN) 320 MG tablet Take 1 tablet (320 mg total) by mouth daily.   No facility-administered encounter medications on file as of 08/06/2023.    Allergies (verified) Ace inhibitors and Sulfa antibiotics   History: Past Medical History:  Diagnosis Date   Groin pain, chronic, right 06/17/2022   HTN (hypertension)    Hyperlipidemia    Prostatitis    Past Surgical History:  Procedure Laterality Date   COLONOSCOPY  06/09/2005   negative reportedly with [facility]   COLONOSCOPY  05/2016   WNL rpt 10 yrs (Dr Nance Pew)   INGUINAL HERNIA REPAIR Right 10/10/2021   Procedure: OPEN RIGHT INGUINAL HERNIA REPAIR WITH MESH;  Surgeon: Harriette Bouillon, MD;  Location: [facility];  Service: General;  Laterality: Right;   VITRECTOMY Left    retinal crease ([place])   Family History  Problem Relation Age of Onset   Alzheimer's disease Mother    Depression Mother    Arthritis Mother    Heart attack Father    Cancer Maternal Grandmother    Colon cancer Maternal Grandfather    Bladder Cancer Paternal Grandfather    Social History   Socioeconomic History   Marital status: Married    Spouse name: Not on file   Number of children: 2  Years of education: Not on file   Highest education level: Bachelor's degree (e.g., BA, AB, BS)  Occupational History    Comment: manage realestate  Tobacco Use   Smoking status: Former    Current packs/day: 0.00    Average packs/day: 0.5 packs/day for 10.0 years (5.0 ttl pk-yrs)    Types: Cigarettes    Start date: 06/10/1963    Quit date: 06/09/1973    Years since quitting: 50.1   Smokeless tobacco: Former    Quit date: 06/09/1973  Substance and Sexual Activity   Alcohol use: No   Drug use: No   Sexual activity: Not on file  Other Topics Concern   Not on file  Social History Narrative   Lives with wife.  Has 2 grown children in [place]   No alcohol in the past 40 years.    Edu: BS college degree   Occ: retired Scientist, research (physical sciences).    Enjoys traveling extensively - fly fishing, hiking, running. Runs 10-15 mi/wk.    Black belt in [place]. Classically trained fiddler. Enjoys growing orchids   Social Drivers of Corporate investment banker Strain: Low Risk  (08/05/2023)   Overall Financial Resource Strain (CARDIA)    Difficulty of Paying Living Expenses: Not hard at all  Food Insecurity: No Food Insecurity (08/05/2023)   Hunger Vital Sign    Worried About Running Out of Food in the Last Year: Never true    Ran Out of Food in the Last Year: Never true  Transportation Needs: No Transportation Needs (08/05/2023)   PRAPARE - Administrator, Civil Service (Medical): No    Lack of Transportation (Non-Medical): No  Physical Activity: Sufficiently Active (08/05/2023)   Exercise Vital Sign    Days of Exercise per Week: 4 days    Minutes of Exercise per Session: 50 min  Stress: No Stress Concern Present (08/05/2023)   [employer] of Occupational Health - Occupational Stress Questionnaire    Feeling of Stress : Not at all  Social Connections: Moderately Integrated (08/05/2023)   Social Connection and Isolation Panel [NHANES]    Frequency of Communication with Friends and Family: More than three times a week    Frequency of Social Gatherings with Friends and Family: Patient declined    Attends Religious Services: Never    Database administrator or Organizations: Yes    Attends Engineer, structural: 1 to 4 times per year    Marital Status: Married    Tobacco Counseling Counseling given: Not Answered    Clinical Intake:  Pre-visit preparation completed: Yes  Pain : No/denies pain     BMI - recorded: 25.96 Nutritional Status: BMI 25 -29 Overweight Nutritional Risks: None Diabetes: No  How often do you need to have someone help you when you read instructions,  pamphlets, or other written materials from your doctor or pharmacy?: 1 - Never  Interpreter Needed?: No  Comments: lives with wife Information entered by :: B.Nyx Keady,LPN   Activities of Daily Living     08/05/2023    4:30 PM  In your present state of health, do you have any difficulty performing the following activities:  Hearing? 0  Vision? 1  Difficulty concentrating or making decisions? 0  Walking or climbing stairs? 0  Dressing or bathing? 0  Doing errands, shopping? 0  Preparing Food and eating ? N  Using the Toilet? N  In the past six months, have you accidently leaked urine? N  Do you  have problems with loss of bowel control? N  Managing your Medications? N  Managing your Finances? N  Housekeeping or managing your Housekeeping? N    Patient Care Team: Eustaquio Boyden, MD as PCP - General (Family Medicine) Exie Parody, MD (Hematology and Oncology)  Indicate any recent Medical Services you may have received from other than [facility] providers in the past year (date may be approximate).     Assessment:   This is a routine wellness examination for [place].  Hearing/Vision screen Hearing Screening - Comments:: Pt says his hearing is good Vision Screening - Comments:: Pt says his vision is good;cataract in left eye Dr Vonna Kotyk Dr Luciana Axe   Goals Addressed               This Visit's Progress     Patient Stated (pt-stated)        I would like to get off some of the medications I am on. I would like to get more lean and stronger       Depression Screen     08/06/2023    3:19 PM 05/04/2023    8:02 AM 09/30/2022    9:25 AM 09/10/2022    8:06 AM 08/01/2022   11:30 AM 06/17/2022   10:11 AM  PHQ 2/9 Scores  PHQ - 2 Score 0 0  0 1 2  PHQ- 9 Score    1 1 8   Exception Documentation   Patient refusal       Fall Risk     08/05/2023    4:30 PM 05/04/2023    8:02 AM 09/10/2022    8:06 AM 08/01/2022   11:30 AM  Fall Risk   Falls in the past year? 0 0 0 0  Risk for fall  due to : No Fall Risks     Follow up Education provided;Falls prevention discussed       MEDICARE RISK AT HOME:  Medicare Risk at Home Any stairs in or around the home?: (Patient-Rptd) Yes If so, are there any without handrails?: (Patient-Rptd) No Home free of loose throw rugs in walkways, pet beds, electrical cords, etc?: (Patient-Rptd) Yes Adequate lighting in your home to reduce risk of falls?: (Patient-Rptd) Yes Life alert?: (Patient-Rptd) No Use of a cane, walker or w/c?: (Patient-Rptd) No Grab bars in the bathroom?: (Patient-Rptd) No Shower chair or bench in shower?: (Patient-Rptd) No Elevated toilet seat or a handicapped toilet?: (Patient-Rptd) No  TIMED UP AND GO:  Was the test performed?  Yes  Length of time to ambulate 10 feet: 12 sec Gait steady and fast without use of assistive device  Cognitive Function: 6CIT completed        08/06/2023    3:29 PM  6CIT Screen  What Year? 0 points  What month? 0 points  What time? 0 points  Count back from 20 0 points  Months in reverse 0 points  Repeat phrase 0 points  Total Score 0 points    Immunizations Immunization History  Administered Date(s) Administered   Fluad Trivalent(High Dose 65+) 03/18/2023    Screening Tests Health Maintenance  Topic Date Due   DTaP/Tdap/Td (1 - Tdap) Never done   Zoster Vaccines- Shingrix (1 of 2) Never done   Pneumonia Vaccine 21+ Years old (1 of 1 - PCV) Never done   COVID-19 Vaccine (1 - 2024-25 season) 04/02/2025 (Originally 02/08/2023)   Medicare Annual Wellness (AWV)  08/05/2024   Colonoscopy  05/13/2026   INFLUENZA VACCINE  Completed   Hepatitis  C Screening  Completed   HPV VACCINES  Aged Out    Health Maintenance  Health Maintenance Due  Topic Date Due   DTaP/Tdap/Td (1 - Tdap) Never done   Zoster Vaccines- Shingrix (1 of 2) Never done   Pneumonia Vaccine 109+ Years old (1 of 1 - PCV) Never done   Health Maintenance Items Addressed: none needed   Additional  Screening:  Vision Screening: Recommended annual ophthalmology exams for early detection of glaucoma and other disorders of the eye.  Dental Screening: Recommended annual dental exams for proper oral hygiene  Community Resource Referral / Chronic Care Management: CRR required this visit?  No   CCM required this visit?  Appt scheduled with PCP    Plan:     I have personally reviewed and noted the following in the patient's chart:   Medical and social history Use of alcohol, tobacco or illicit drugs  Current medications and supplements including opioid prescriptions. Patient is not currently taking opioid prescriptions. Functional ability and status Nutritional status Physical activity Advanced directives List of other physicians Hospitalizations, surgeries, and ER visits in previous 12 months Vitals Screenings to include cognitive, depression, and falls Referrals and appointments  In addition, I have reviewed and discussed with patient certain preventive protocols, quality metrics, and best practice recommendations. A written personalized care plan for preventive services as well as general preventive health recommendations were provided to patient.    Sue Lush, LPN   4/33/2951   After Visit Summary: (MyChart) Due to this being a telephonic visit, the after visit summary with patients personalized plan was offered to patient via MyChart   Notes: Nothing significant to report at this time.

## 2023-08-07 ENCOUNTER — Other Ambulatory Visit: Payer: Self-pay | Admitting: Family Medicine

## 2023-08-07 DIAGNOSIS — I10 Essential (primary) hypertension: Secondary | ICD-10-CM

## 2023-09-07 ENCOUNTER — Other Ambulatory Visit: Payer: Self-pay | Admitting: Family Medicine

## 2023-09-07 NOTE — Telephone Encounter (Signed)
 Change in therapy. Strength decreased to 2.5 mg daily (see 09/30/22 OV notes). New rx sent on 09/30/22, #90/3 to [facility].   Request denied.

## 2023-10-06 ENCOUNTER — Ambulatory Visit (INDEPENDENT_AMBULATORY_CARE_PROVIDER_SITE_OTHER): Payer: Medicare Other | Admitting: Family Medicine

## 2023-10-06 ENCOUNTER — Encounter: Payer: Self-pay | Admitting: Family Medicine

## 2023-10-06 VITALS — BP 180/102 | HR 120 | Temp 98.2°F | Ht 65.5 in | Wt 150.1 lb

## 2023-10-06 DIAGNOSIS — D751 Secondary polycythemia: Secondary | ICD-10-CM

## 2023-10-06 DIAGNOSIS — E785 Hyperlipidemia, unspecified: Secondary | ICD-10-CM | POA: Diagnosis not present

## 2023-10-06 DIAGNOSIS — I10 Essential (primary) hypertension: Secondary | ICD-10-CM | POA: Diagnosis not present

## 2023-10-06 DIAGNOSIS — G8929 Other chronic pain: Secondary | ICD-10-CM

## 2023-10-06 DIAGNOSIS — R972 Elevated prostate specific antigen [PSA]: Secondary | ICD-10-CM

## 2023-10-06 DIAGNOSIS — R1031 Right lower quadrant pain: Secondary | ICD-10-CM

## 2023-10-06 DIAGNOSIS — Z7189 Other specified counseling: Secondary | ICD-10-CM

## 2023-10-06 LAB — CBC WITH DIFFERENTIAL/PLATELET
Basophils Absolute: 0 10*3/uL (ref 0.0–0.1)
Basophils Relative: 0.8 % (ref 0.0–3.0)
Eosinophils Absolute: 0.1 10*3/uL (ref 0.0–0.7)
Eosinophils Relative: 1.1 % (ref 0.0–5.0)
HCT: 51.8 % (ref 39.0–52.0)
Hemoglobin: 17.5 g/dL — ABNORMAL HIGH (ref 13.0–17.0)
Lymphocytes Relative: 14.6 % (ref 12.0–46.0)
Lymphs Abs: 0.8 10*3/uL (ref 0.7–4.0)
MCHC: 33.8 g/dL (ref 30.0–36.0)
MCV: 92.6 fl (ref 78.0–100.0)
Monocytes Absolute: 0.5 10*3/uL (ref 0.1–1.0)
Monocytes Relative: 8.2 % (ref 3.0–12.0)
Neutro Abs: 4.4 10*3/uL (ref 1.4–7.7)
Neutrophils Relative %: 75.3 % (ref 43.0–77.0)
Platelets: 280 10*3/uL (ref 150.0–400.0)
RBC: 5.59 Mil/uL (ref 4.22–5.81)
RDW: 13.9 % (ref 11.5–15.5)
WBC: 5.8 10*3/uL (ref 4.0–10.5)

## 2023-10-06 LAB — LIPID PANEL
Cholesterol: 178 mg/dL (ref 0–200)
HDL: 58.5 mg/dL (ref >39.00)
LDL Cholesterol: 90 mg/dL (ref 0–99)
NonHDL: 119.97
Total CHOL/HDL Ratio: 3
Triglycerides: 149 mg/dL (ref 0.0–149.0)
VLDL: 29.8 mg/dL (ref 0.0–40.0)

## 2023-10-06 LAB — COMPREHENSIVE METABOLIC PANEL WITH GFR
ALT: 18 U/L (ref 0–53)
AST: 24 U/L (ref 0–37)
Albumin: 4.6 g/dL (ref 3.5–5.2)
Alkaline Phosphatase: 64 U/L (ref 39–117)
BUN: 17 mg/dL (ref 6–23)
CO2: 31 meq/L (ref 19–32)
Calcium: 10.3 mg/dL (ref 8.4–10.5)
Chloride: 101 meq/L (ref 96–112)
Creatinine, Ser: 1.23 mg/dL (ref 0.40–1.50)
GFR: 60.12 mL/min (ref >60.00)
Glucose, Bld: 112 mg/dL — ABNORMAL HIGH (ref 70–99)
Potassium: 4.9 meq/L (ref 3.5–5.1)
Sodium: 141 meq/L (ref 135–145)
Total Bilirubin: 0.6 mg/dL (ref 0.2–1.2)
Total Protein: 7.7 g/dL (ref 6.0–8.3)

## 2023-10-06 LAB — TSH: TSH: 2.29 u[IU]/mL (ref 0.35–5.50)

## 2023-10-06 MED ORDER — ATORVASTATIN CALCIUM 10 MG PO TABS: 10.0000 mg | ORAL_TABLET | Freq: Every day | ORAL | 4 refills | Status: AC

## 2023-10-06 MED ORDER — VALSARTAN 320 MG PO TABS: 320.0000 mg | ORAL_TABLET | Freq: Every day | ORAL | 4 refills | Status: AC

## 2023-10-06 MED ORDER — AMITRIPTYLINE HCL 25 MG PO TABS: 25.0000 mg | ORAL_TABLET | Freq: Every day | ORAL | 3 refills | Status: AC

## 2023-10-06 MED ORDER — METOPROLOL SUCCINATE ER 25 MG PO TB24: 25.0000 mg | ORAL_TABLET | Freq: Every day | ORAL | 4 refills | Status: AC

## 2023-10-06 MED ORDER — AMLODIPINE BESYLATE 2.5 MG PO TABS: 2.5000 mg | ORAL_TABLET | Freq: Every day | ORAL | 4 refills | Status: AC

## 2023-10-06 NOTE — Assessment & Plan Note (Signed)
 Normalized on repeat testing. Did have abnormal prostate MRI - continues active surveillance with uro. Pending uro f/u next month.

## 2023-10-06 NOTE — Progress Notes (Signed)
 Ph: [phone] Fax: [phone]   Patient ID: Danton Dyers, male    DOB: [birth year], 68 y.o.   MRN: [id number]  This visit was conducted in person.  BP (!) 180/102   Pulse (!) 120   Temp 98.2 F (36.8 C) (Oral)   Ht 5' 5.5" (1.664 m)   Wt 150 lb 2 oz (68.1 kg)   SpO2 97%   BMI 24.60 kg/m   BP Readings from Last 3 Encounters:  10/06/23 (!) 180/102  08/06/23 (!) 140/82  05/04/23 134/78  Elevated on repeat testing   CC: AMW f/u visit  Subjective:   HPI: MARSHAUN MARRESE is a 68 y.o. male presenting on 10/06/2023 for Annual Exam (MCR prt 2 [AWV- 08/06/23]. Pt brought in home BP monitor to compare. Reading in office today- 190/111. Thinks may be due to new eye drops. )   Saw health advisor 07/2023 for medicare wellness visit. Note reviewed.   No results found.  Flowsheet Row Office Visit from 10/06/2023 in [facility]  PHQ-2 Total Score 0          10/06/2023    8:59 AM 08/05/2023    4:30 PM 05/04/2023    8:02 AM 09/10/2022    8:06 AM 08/01/2022   11:30 AM  Fall Risk   Falls in the past year? 0 0 0 0 0  Risk for fall due to :  No Fall Risks     Follow up  Education provided;Falls prevention discussed       Chronic groin pain - ongoing pain despite inguinal hernia repair 10/2021 (albeit improved after surgery). 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 Hip xrays overall unrevealing (mild DJD).  Had PT through urology without much benefit.  He continues seeing Alva Jewels PT at [facility] in [place].  Significant improvement after starting amitriptyline  25 mg nightly.   HTN - managed with amlodipine  2.5mg  daily, toprol  XL 25mg  daily, valsartan  320mg  daily. Does check BP at home - overall well controlled 99-138/70-90s, HR 70-90. Over the past week notes increased blood pressure  readings at home. He has not been taking amlodipine  for several weeks.   He continues running - and tolerates this well.  Limits caffeine.  Denies HA, vision changes, CP/tightness, SOB, leg swelling.    Cataract surgery 09/21/2023 Demetrios Finders, [place]) - wonders if some of his new eye drops are contributing (steroid, ketorolac, gentamicin).   Preventative: Colonoscopy 2019 WNL per pt report Orion Birks). No bowel changes or blood in stool  Prostate cancer screening - h/o abnormal prostate MRI 03/2023 - followed by urology - upcoming app in May. He's been using pumpkin seed oil. He's tapering off tamsulosin.  Lung cancer screening - not eligible  Flu shot - yearly COVID shot - x4 Tdap 08/2018 Pneumonia shot - unsure - will check with pharmacy Shingrix - completed Advanced directive discussion - has this at home - working on updating. Wife is HCPOA.  Seat belt use discussed Sunscreen use discussed. No changing moles on skin.  Ex smoker - quit 1975  Alcohol - none Dentist - q6 mo Eye exam - yearly Bowel - no constipation  Bladder -  no incontinence    Lives with wife. Has 2 grown children in [place]  No alcohol in the past 40 years.  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  Enjoys traveling extensively - fly fishing, hiking, running. Runs 10-15 mi/wk.      Relevant past medical, surgical, family and social history reviewed and updated as indicated. Interim medical history since our last visit reviewed. Allergies and medications reviewed and updated. Outpatient Medications Prior to Visit  Medication Sig Dispense Refill   Cholecalciferol (VITAMIN D3 PO) Take by mouth daily.     Cyanocobalamin (VITAMIN B12 PO) Take by mouth daily.     gatifloxacin (ZYMAXID) 0.5 % SOLN SMARTSIG:In Eye(s)     ketorolac (ACULAR) 0.5 % ophthalmic solution Place 1 drop into the left eye 4 (four) times daily.     Krill Oil 500 MG CAPS Take by mouth.     Misc Natural Products (OSTEO BI-FLEX JOINT SHIELD PO) Take by  mouth.     MISC NATURAL PRODUCTS PO Take by mouth. Serra enzyme     prednisoLONE acetate (PRED FORTE) 1 % ophthalmic suspension SMARTSIG:In Eye(s)     tamsulosin (FLOMAX) 0.4 MG CAPS capsule Take 0.4 mg by mouth daily.     Turmeric 500 MG CAPS Take by mouth.     amitriptyline  (ELAVIL ) 50 MG tablet TAKE 1 TABLET BY MOUTH AT BEDTIME 30 tablet 3   amLODipine  (NORVASC ) 2.5 MG tablet Take 1 tablet (2.5 mg total) by mouth daily. 90 tablet 3   atorvastatin  (LIPITOR) 10 MG tablet Take 1 tablet (10 mg total) by mouth daily. 90 tablet 4   LORazepam  (ATIVAN ) 0.5 MG tablet Take 1 tablet (0.5 mg total) by mouth at bedtime as needed for sleep (sedation precautions). 20 tablet 0   metoprolol  succinate (TOPROL -XL) 25 MG 24 hr tablet TAKE 1 TABLET BY MOUTH DAILY 90 tablet 0   valsartan  (DIOVAN ) 320 MG tablet Take 1 tablet (320 mg total) by mouth daily. 90 tablet 3   No facility-administered medications prior to visit.     Per HPI unless specifically indicated in ROS section below Review of Systems  Constitutional:  Negative for activity change, appetite change, chills, fatigue, fever and unexpected weight change.  HENT:  Positive for congestion. Negative for hearing loss.   Eyes:  Negative for visual disturbance.  Respiratory:  Negative for cough, chest tightness, shortness of breath and wheezing.   Cardiovascular:  Positive for palpitations (fast heart rate today). Negative for chest pain and leg swelling.  Gastrointestinal:  Negative for abdominal distention, abdominal pain, blood in stool, constipation, diarrhea, nausea and vomiting.  Genitourinary:  Negative for difficulty urinating and hematuria.  Musculoskeletal:  Negative for arthralgias, myalgias and neck pain.  Skin:  Negative for rash.  Neurological:  Positive for headaches (rare). Negative for dizziness, seizures and syncope.  Hematological:  Negative for adenopathy. Does not bruise/bleed easily.  Psychiatric/Behavioral:  Negative for dysphoric  mood. The patient is not nervous/anxious.     Objective:  BP (!) 180/102   Pulse (!) 120   Temp 98.2 F (36.8 C) (Oral)   Ht 5' 5.5" (1.664 m)   Wt 150 lb 2 oz (68.1 kg)   SpO2 97%   BMI 24.60 kg/m   Wt Readings from Last 3 Encounters:  10/06/23 150 lb 2 oz (68.1 kg)  08/06/23 156 lb (70.8 kg)  05/04/23 149 lb (67.6 kg)      Physical Exam Vitals and nursing note reviewed.  Constitutional:      General: He is not in acute distress.    Appearance: Normal appearance. He is well-developed. He is not ill-appearing.  HENT:  Head: Normocephalic and atraumatic.     Right Ear: Hearing, tympanic membrane, ear canal and external ear normal.     Left Ear: Hearing, tympanic membrane, ear canal and external ear normal.     Nose: Nose normal.     Mouth/Throat:     Mouth: Mucous membranes are moist.     Pharynx: Oropharynx is clear. No oropharyngeal exudate or posterior oropharyngeal erythema.  Eyes:     General: No scleral icterus.    Extraocular Movements: Extraocular movements intact.     Conjunctiva/sclera: Conjunctivae normal.     Pupils: Pupils are equal, round, and reactive to light.  Neck:     Thyroid : No thyroid  mass or thyromegaly.     Vascular: No carotid bruit.  Cardiovascular:     Rate and Rhythm: Normal rate and regular rhythm.     Pulses: Normal pulses.          Radial pulses are 2+ on the right side and 2+ on the left side.     Heart sounds: Normal heart sounds. No murmur heard. Pulmonary:     Effort: Pulmonary effort is normal. No respiratory distress.     Breath sounds: Normal breath sounds. No wheezing, rhonchi or rales.  Abdominal:     General: Bowel sounds are normal. There is no distension.     Palpations: Abdomen is soft. There is no mass.     Tenderness: There is no abdominal tenderness. There is no guarding or rebound.     Hernia: No hernia is present.  Musculoskeletal:        General: Normal range of motion.     Cervical back: Normal range of motion  and neck supple.     Right lower leg: No edema.     Left lower leg: No edema.  Lymphadenopathy:     Cervical: No cervical adenopathy.  Skin:    General: Skin is warm and dry.     Findings: No rash.  Neurological:     General: No focal deficit present.     Mental Status: He is alert and oriented to person, place, and time.  Psychiatric:        Mood and Affect: Mood normal.        Behavior: Behavior normal.        Thought Content: Thought content normal.        Judgment: Judgment normal.       Results for orders placed or performed in visit on 04/18/23  PSA   Collection Time: 04/07/23 12:00 AM  Result Value Ref Range   PSA 2.31     Assessment & Plan:   Problem List Items Addressed This Visit     Advanced directives, counseling/discussion - Primary (Chronic)   Has this at home - asked to bring us  copy       White coat syndrome with diagnosis of hypertension   Chronic, markedly elevated BP readings in office, confirmed with both home and office cuffs, in setting of running out of amlodipine  2 wks ago and recent cataract surgery. Restart amlodipine  2.5mg  daily, continue to monitor blood pressures at home, will let me know if consistently >140/90 at home to further titrate antihypertensives.       Relevant Medications   amLODipine  (NORVASC ) 2.5 MG tablet   atorvastatin  (LIPITOR) 10 MG tablet   metoprolol  succinate (TOPROL -XL) 25 MG 24 hr tablet   valsartan  (DIOVAN ) 320 MG tablet   Hyperlipidemia   Chronic, stable on atorvastatin  10mg  daily - update  FLP. The 10-year ASCVD risk score (Arnett DK, et al., 2019) is: 28%   Values used to calculate the score:     Age: 68 years     Sex: Male     Is Non-Hispanic African American: No     Diabetic: No     Tobacco smoker: No     Systolic Blood Pressure: 180 mmHg     Is BP treated: Yes     HDL Cholesterol: 56.3 mg/dL     Total Cholesterol: 181 mg/dL       Relevant Medications   amLODipine  (NORVASC ) 2.5 MG tablet    atorvastatin  (LIPITOR) 10 MG tablet   metoprolol  succinate (TOPROL -XL) 25 MG 24 hr tablet   valsartan  (DIOVAN ) 320 MG tablet   Other Relevant Orders   Lipid panel   Comprehensive metabolic panel with GFR   TSH   Groin pain, chronic, right   Chronic, overall stable period on low dose amitriptyline  - continue.       Relevant Medications   amitriptyline  (ELAVIL ) 25 MG tablet   Polycythemia   Update CBC.       Relevant Orders   CBC with Differential/Platelet   Elevated PSA   Normalized on repeat testing. Did have abnormal prostate MRI - continues active surveillance with uro. Pending uro f/u next month.         Meds ordered this encounter  Medications   amitriptyline  (ELAVIL ) 25 MG tablet    Sig: Take 1 tablet (25 mg total) by mouth at bedtime.    Dispense:  90 tablet    Refill:  3   amLODipine  (NORVASC ) 2.5 MG tablet    Sig: Take 1 tablet (2.5 mg total) by mouth daily.    Dispense:  90 tablet    Refill:  4   atorvastatin  (LIPITOR) 10 MG tablet    Sig: Take 1 tablet (10 mg total) by mouth daily.    Dispense:  90 tablet    Refill:  4   metoprolol  succinate (TOPROL -XL) 25 MG 24 hr tablet    Sig: Take 1 tablet (25 mg total) by mouth daily.    Dispense:  90 tablet    Refill:  4   valsartan  (DIOVAN ) 320 MG tablet    Sig: Take 1 tablet (320 mg total) by mouth daily.    Dispense:  90 tablet    Refill:  4    Orders Placed This Encounter  Procedures   Lipid panel   Comprehensive metabolic panel with GFR   CBC with Differential/Platelet   TSH    Patient Instructions  Labs today  Bring me copy of immunizations from [employer].  Blood pressures were too high - restart amlodipine  2.5mg  daily, continue monitoring blood pressures at home and let me know if consistently running >140/90.  Bring us  copy of your living will/advanced directive.  Good to see you today  Return in 4-6 weeks for hypertension follow up visit   Follow up plan: Return in about 6 weeks (around  11/17/2023) for follow up visit.  Claire Crick, MD

## 2023-10-06 NOTE — Assessment & Plan Note (Signed)
 Chronic, markedly elevated BP readings in office, confirmed with both home and office cuffs, in setting of running out of amlodipine  2 wks ago and recent cataract surgery. Restart amlodipine  2.5mg  daily, continue to monitor blood pressures at home, will let me know if consistently >140/90 at home to further titrate antihypertensives.

## 2023-10-06 NOTE — Assessment & Plan Note (Signed)
 Update CBC.

## 2023-10-06 NOTE — Assessment & Plan Note (Signed)
 Chronic, overall stable period on low dose amitriptyline  - continue.

## 2023-10-06 NOTE — Assessment & Plan Note (Signed)
 Chronic, stable on atorvastatin  10mg  daily - update FLP. The 10-year ASCVD risk score (Arnett DK, et al., 2019) is: 28%   Values used to calculate the score:     Age: 68 years     Sex: Male     Is Non-Hispanic African American: No     Diabetic: No     Tobacco smoker: No     Systolic Blood Pressure: 180 mmHg     Is BP treated: Yes     HDL Cholesterol: 56.3 mg/dL     Total Cholesterol: 181 mg/dL

## 2023-10-06 NOTE — Patient Instructions (Addendum)
 Labs today  Bring me copy of immunizations from [employer].  Blood pressures were too high - restart amlodipine  2.5mg  daily, continue monitoring blood pressures at home and let me know if consistently running >140/90.  Bring us  copy of your living will/advanced directive.  Good to see you today  Return in 4-6 weeks for hypertension follow up visit

## 2023-10-06 NOTE — Assessment & Plan Note (Addendum)
 Has this at home - asked to bring us  copy

## 2023-10-08 ENCOUNTER — Encounter: Payer: Self-pay | Admitting: Family Medicine

## 2023-11-11 ENCOUNTER — Telehealth: Payer: Self-pay

## 2023-11-11 DIAGNOSIS — Z7189 Other specified counseling: Secondary | ICD-10-CM

## 2023-11-11 NOTE — Telephone Encounter (Signed)
 Pt dropped off copy of advance directive and BP log.   Placed in Dr Ocie Belt box.

## 2023-11-12 NOTE — Telephone Encounter (Signed)
 See wife's phone note as well.  Living will reviewed and sent for scanning. It did not have HCPOA form attached. Do they have this at home or do they need a copy to fill out?   Reviewed BP readings -  Some lows but overall good control. Continue current regimen as long as not having low BP symptoms (orthostatic dizziness, lightheadedness, fatigue).

## 2023-11-13 NOTE — Telephone Encounter (Signed)
 Noted

## 2023-11-13 NOTE — Telephone Encounter (Signed)
Lvm asking pt to call back.  Need to get answer to Dr. Synthia Innocent question and relay his message.

## 2023-11-13 NOTE — Telephone Encounter (Signed)
 Patient made aware and states he will look to see if he has the [facility] form.

## 2023-11-18 ENCOUNTER — Other Ambulatory Visit: Payer: Self-pay | Admitting: Family Medicine

## 2023-11-18 NOTE — Telephone Encounter (Signed)
 Dose decreased to 25 mg daily at bedtime (see 10/06/23 OV notes). New rx sent 10/06/23, #90/3 refills to [facility].   Request denied.

## 2024-08-08 ENCOUNTER — Ambulatory Visit: Payer: Medicare Other
